# Patient Record
Sex: Female | Born: 1970 | Race: White | Hispanic: No | Marital: Married | Smoking: Never smoker
Health system: Southern US, Community
[De-identification: ages and names within clinical notes are randomized; demographics above are authoritative.]

## PROBLEM LIST (undated history)

## (undated) DIAGNOSIS — G44009 Cluster headache syndrome, unspecified, not intractable: Secondary | ICD-10-CM

## (undated) HISTORY — PX: WISDOM TOOTH EXTRACTION: SHX21

## (undated) HISTORY — DX: Cluster headache syndrome, unspecified, not intractable: G44.009

---
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
## 2007-02-12 ENCOUNTER — Emergency Department (HOSPITAL_COMMUNITY): Admission: EM | Admit: 2007-02-12 | Discharge: 2007-02-13 | Payer: Self-pay | Admitting: Emergency Medicine

## 2007-02-14 ENCOUNTER — Inpatient Hospital Stay (HOSPITAL_COMMUNITY): Admission: AD | Admit: 2007-02-14 | Discharge: 2007-02-14 | Payer: Self-pay | Admitting: Obstetrics and Gynecology

## 2007-02-16 DIAGNOSIS — O039 Complete or unspecified spontaneous abortion without complication: Secondary | ICD-10-CM

## 2007-02-16 HISTORY — PX: DILATION AND CURETTAGE OF UTERUS: SHX78

## 2007-02-16 HISTORY — DX: Complete or unspecified spontaneous abortion without complication: O03.9

## 2007-02-16 HISTORY — PX: OTHER SURGICAL HISTORY: SHX169

## 2007-03-16 ENCOUNTER — Encounter (INDEPENDENT_AMBULATORY_CARE_PROVIDER_SITE_OTHER): Payer: Self-pay | Admitting: Obstetrics and Gynecology

## 2007-03-16 ENCOUNTER — Ambulatory Visit (HOSPITAL_COMMUNITY): Admission: RE | Admit: 2007-03-16 | Discharge: 2007-03-16 | Payer: Self-pay | Admitting: Internal Medicine

## 2009-12-24 ENCOUNTER — Other Ambulatory Visit: Admission: RE | Admit: 2009-12-24 | Discharge: 2009-12-24 | Payer: Self-pay | Admitting: Internal Medicine

## 2010-04-01 ENCOUNTER — Other Ambulatory Visit: Payer: Self-pay | Admitting: Obstetrics and Gynecology

## 2010-06-30 NOTE — Op Note (Signed)
NAMEPATTI, SHORB NO.:  [id number]   MEDICAL RECORD NO.:  [id number]          PATIENT TYPE:  AMB   LOCATION:  SDC                           FACILITY:  [facility]   PHYSICIAN:  Malachi Pro. Ambrose Mantle, M.D. DATE OF BIRTH:  [birth year]   DATE OF PROCEDURE:  03/16/2007  DATE OF DISCHARGE:                               OPERATIVE REPORT   PREOPERATIVE DIAGNOSES:  Nonviable intrauterine pregnancy at 12+ weeks.   POSTOPERATIVE DIAGNOSES:  Nonviable intrauterine pregnancy at 12+ weeks.   OPERATION:  D&E.   OPERATOR:  Dr. Ambrose Mantle.   ANESTHESIA:  General anesthesia.   The patient was brought to the operating room and placed under  satisfactory general anesthesia.  She had had a normal intrauterine  pregnancy at 10 weeks and 1 day by ultrasound but at her a new OB workup  at 13 weeks and 2 days, there was no heart rate,  ultrasound showed the  gestation to be 12 weeks and 6 days with no movement and no fetal heart  rate. One week later, the ultrasound was repeated the fetus measured 12  weeks and 2 days, no movement no fetal heart rate. She was brought to  the operating room, placed under satisfactory general anesthesia and  placed in lithotomy position in the [place] stirrups.  Exam revealed the  uterus to be retroverted, approximately 12-13 weeks size, the adnexa  were free of masses.  The vulva, vagina and perineum were prepped with  Betadine solution and draped as a sterile field. After prepping the  urethra, the bladder was emptied with a [place] catheter.  The cervix was  grasped with a tenaculum.  There was a small amount of tissue in the  cervical opening but I do not think this was products of conception.  The uterus was sounded very carefully posteriorly to 13 cm.  The  cervical canal was then dilated to a Pratt dilator of 35. I then wanted  to reduce the size of the endometrial cavity, so I used blunt  instruments using the ring forceps to rupture the membranes and I  evacuated what looked like most of the fetus and then in spite of the  fact the uterus was quite retroverted, the placenta was on the anterior  wall of the uterus and with multiple circuits with the ring forceps  I  was able to reduce the size of the uterine cavity significantly by  removing placental tissue. Pitocin was begun and then I also massaged  the uterus to try to contract the uterus more. Then I began using the #9  curved suction curette and I was able to curette the endometrial cavity  until it felt like there was no tissue left.  I then used a sharp  curette to curette the endometrial cavity to see if there were any bumps  on the surface of the endometrial cavity, I could feel none.  I went  again with the curved suction curette, found no additional tissue and  terminated the procedure.  Blood loss was difficult to estimate because  there was amniotic  fluid, but I estimated blood loss at about 200 mL.  Sponge and needle counts were correct.  The patient was returned to  recovery in satisfactory condition.  Her blood type is O+.      Malachi Pro. Ambrose Mantle, M.D.  Electronically Signed    TFH/MEDQ  D:  03/16/2007  T:  03/16/2007  Job:  [id number]

## 2010-11-05 LAB — DIFFERENTIAL
Eosinophils Absolute: 0.1
Lymphs Abs: 1.9
Monocytes Absolute: 0.5
Monocytes Relative: 4
Neutrophils Relative %: 76

## 2010-11-05 LAB — CBC
Hemoglobin: 12.4
MCHC: 35.2
RDW: 12.3

## 2010-11-05 LAB — COMPREHENSIVE METABOLIC PANEL
Albumin: 3 — ABNORMAL LOW
CO2: 22
Chloride: 106
Glucose, Bld: 92
Total Protein: 6

## 2010-11-05 LAB — ABO/RH: ABO/RH(D): O POS

## 2010-11-20 LAB — URINALYSIS, ROUTINE W REFLEX MICROSCOPIC
Glucose, UA: NEGATIVE
Hgb urine dipstick: NEGATIVE
Ketones, ur: >80 — AB
Leukocytes, UA: NEGATIVE
Nitrite: NEGATIVE
Protein, ur: 30 — AB
Specific Gravity, Urine: 1.026
Urobilinogen, UA: 1
pH: 6

## 2010-11-20 LAB — CBC
HCT: 38.4
Hemoglobin: 13.2
MCHC: 34.5
MCV: 89.8
Platelets: 308
RBC: 4.27
RDW: 12.7
WBC: 15.1 — ABNORMAL HIGH

## 2010-11-20 LAB — COMPREHENSIVE METABOLIC PANEL
Albumin: 3.2 — ABNORMAL LOW
Alkaline Phosphatase: 70
CO2: 25
Potassium: 3.9
Total Bilirubin: 0.7

## 2010-11-20 LAB — COMPREHENSIVE METABOLIC PANEL WITH GFR
ALT: 13
AST: 13
BUN: 4 — ABNORMAL LOW
Calcium: 9.4
Chloride: 103
Creatinine, Ser: 0.61
GFR calc Af Amer: >60
GFR calc non Af Amer: >60
Glucose, Bld: 108 — ABNORMAL HIGH
Sodium: 135
Total Protein: 6.9

## 2010-11-20 LAB — DIFFERENTIAL
Basophils Absolute: 0
Basophils Relative: 0
Eosinophils Absolute: 0
Eosinophils Relative: 0
Lymphocytes Relative: 8 — ABNORMAL LOW
Lymphs Abs: 1.1
Monocytes Absolute: 0.3
Monocytes Relative: 2 — ABNORMAL LOW
Neutro Abs: 13.6 — ABNORMAL HIGH
Neutrophils Relative %: 90 — ABNORMAL HIGH

## 2010-11-20 LAB — WET PREP, GENITAL
Trich, Wet Prep: NONE SEEN
Yeast Wet Prep HPF POC: NONE SEEN

## 2010-11-20 LAB — GC/CHLAMYDIA PROBE AMP, GENITAL
Chlamydia, DNA Probe: NEGATIVE
GC Probe Amp, Genital: NEGATIVE

## 2010-11-20 LAB — URINE MICROSCOPIC-ADD ON

## 2010-11-20 LAB — HCG, QUANTITATIVE, PREGNANCY: hCG, Beta Chain, Quant, S: 148647 — ABNORMAL HIGH

## 2010-11-20 LAB — PREGNANCY, URINE: Preg Test, Ur: POSITIVE

## 2010-11-20 LAB — ABO/RH: ABO/RH(D): O POS

## 2011-02-16 HISTORY — PX: KNEE SURGERY: SHX244

## 2011-10-19 ENCOUNTER — Ambulatory Visit: Payer: Self-pay | Admitting: Sports Medicine

## 2013-02-22 ENCOUNTER — Encounter: Payer: Self-pay | Admitting: Family Medicine

## 2013-02-22 ENCOUNTER — Ambulatory Visit (INDEPENDENT_AMBULATORY_CARE_PROVIDER_SITE_OTHER): Payer: BC Managed Care – PPO | Admitting: Family Medicine

## 2013-02-22 VITALS — BP 126/76 | HR 76 | Temp 98.1°F | Ht 64.0 in | Wt 190.8 lb

## 2013-02-22 DIAGNOSIS — I889 Nonspecific lymphadenitis, unspecified: Secondary | ICD-10-CM

## 2013-02-22 DIAGNOSIS — J309 Allergic rhinitis, unspecified: Secondary | ICD-10-CM

## 2013-02-22 LAB — CBC WITH DIFFERENTIAL/PLATELET
BASOS PCT: 0.4 % (ref 0.0–3.0)
Basophils Absolute: 0 10*3/uL (ref 0.0–0.1)
EOS PCT: 1.2 % (ref 0.0–5.0)
Eosinophils Absolute: 0.1 10*3/uL (ref 0.0–0.7)
HEMATOCRIT: 39.1 % (ref 36.0–46.0)
HEMOGLOBIN: 13.1 g/dL (ref 12.0–15.0)
Lymphocytes Relative: 20 % (ref 12.0–46.0)
Lymphs Abs: 2.4 10*3/uL (ref 0.7–4.0)
MCHC: 33.5 g/dL (ref 30.0–36.0)
MCV: 89.7 fl (ref 78.0–100.0)
MONO ABS: 0.6 10*3/uL (ref 0.1–1.0)
Monocytes Relative: 4.9 % (ref 3.0–12.0)
NEUTROS PCT: 73.5 % (ref 43.0–77.0)
Neutro Abs: 8.7 10*3/uL — ABNORMAL HIGH (ref 1.4–7.7)
Platelets: 314 10*3/uL (ref 150.0–400.0)
RBC: 4.36 Mil/uL (ref 3.87–5.11)
RDW: 12.7 % (ref 11.5–14.6)
WBC: 11.9 10*3/uL — AB (ref 4.5–10.5)

## 2013-02-22 MED ORDER — FLUTICASONE PROPIONATE 50 MCG/ACT NA SUSP: 2.0000 | Freq: Every day | NASAL | Status: DC

## 2013-02-22 NOTE — Progress Notes (Signed)
   Subjective:    Patient ID: Teresa Bauer, female    DOB: June 02, 1970, 43 y.o.   MRN: [id number]  HPI   Very pleasant 43 yo female here to establish care with complaint of:  1.  Lump on back of neck that was painful- improving now.  Noticed it last week.  Was never warm to touch that she is aware of. No scalp infections or itching.  2.  Allergic rhinitis- watery/itchy eyes, sinus pressure and ears popping x 2 months.  Works in a [employer] at [employer].  Constantly around dust. Not sure if she is allergic to dust.  No CP, SOB.  Patient Active Problem List   Diagnosis Date Noted  . Allergic rhinitis 02/22/2013  . Lymphadenitis 02/22/2013   Past Medical History  Diagnosis Date  . Headaches, cluster    Past Surgical History  Procedure Laterality Date  . Miscarriage  2009  . Knee surgery  2013    L knee  . Tonsillectomy  1988  . Wisdom tooth extraction  1991-92   History  Substance Use Topics  . Smoking status: Never Smoker   . Smokeless tobacco: Not on file  . Alcohol Use: Yes   Family History  Problem Relation Age of Onset  . Heart disease Mother   . Diabetes Mother   . Hypertension Mother   . Heart disease Father   . Hypertension Father   . Hypertension Sister   . Asthma Sister   . Hypertension Brother   . Heart disease Maternal Grandmother    Allergies  Allergen Reactions  . Aspirin    No current outpatient prescriptions on file prior to visit.   No current facility-administered medications on file prior to visit.   The PMH, PSH, Social History, Family History, Medications, and allergies have been reviewed in [facility], and have been updated if relevant.  Review of Systems  Constitutional: Negative for fever, fatigue and unexpected weight change.  HENT: Positive for congestion, ear pain, postnasal drip, rhinorrhea and sinus pressure.   Eyes: Negative for photophobia, redness and visual disturbance.  Respiratory: Negative for apnea and chest tightness.          Objective:   Physical Exam  Nursing note and vitals reviewed. Constitutional: She appears well-developed and well-nourished. No distress.  HENT:  Head: Normocephalic.  Right Ear: Hearing normal. Tympanic membrane is retracted.  Left Ear: Hearing normal. Tympanic membrane is retracted.  Nose: Mucosal edema and rhinorrhea present. No sinus tenderness.  Eyes: Conjunctivae are normal. Pupils are equal, round, and reactive to light.  Lymphadenopathy:       Head (right side): No submental, no submandibular, no tonsillar, no preauricular, no posterior auricular and no occipital adenopathy present.       Head (left side): No submental, no submandibular, no tonsillar, no preauricular, no posterior auricular and no occipital adenopathy present.    She has no cervical adenopathy.    She has no axillary adenopathy.  Skin: Skin is warm, dry and intact.  Psychiatric: She has a normal mood and affect. Her speech is normal and behavior is normal.          Assessment & Plan:

## 2013-02-22 NOTE — Patient Instructions (Addendum)
Nice to meet you. Please start flonase and Allegra or Claritin OTC.  I will call you with your lab results.  Please schedule a complete physical after 05/2013.

## 2013-02-22 NOTE — Assessment & Plan Note (Signed)
No mass palpable on exam. Probable lymphadenitis that resolved. Reassurance provided. Will check CBC. Orders Placed This Encounter  Procedures  . CBC with Differential

## 2013-02-22 NOTE — Progress Notes (Signed)
Pre-visit discussion using our clinic review tool. No additional management support is needed unless otherwise documented below in the visit note.  

## 2013-02-22 NOTE — Assessment & Plan Note (Signed)
Deteriorated. eRx for flonase Advised claritin or allegra. Call or return to clinic prn if these symptoms worsen or fail to improve as anticipated. She declines referral to allergist for testing.

## 2013-06-08 ENCOUNTER — Encounter: Payer: BC Managed Care – PPO | Admitting: Family Medicine

## 2013-06-15 ENCOUNTER — Ambulatory Visit (INDEPENDENT_AMBULATORY_CARE_PROVIDER_SITE_OTHER): Payer: BC Managed Care – PPO | Admitting: Internal Medicine

## 2013-06-15 ENCOUNTER — Encounter: Payer: Self-pay | Admitting: Internal Medicine

## 2013-06-15 VITALS — BP 108/70 | HR 73 | Temp 98.3°F | Wt 184.0 lb

## 2013-06-15 DIAGNOSIS — J309 Allergic rhinitis, unspecified: Secondary | ICD-10-CM

## 2013-06-15 NOTE — Progress Notes (Signed)
Pre visit review using our clinic review tool, if applicable. No additional management support is needed unless otherwise documented below in the visit note. 

## 2013-06-15 NOTE — Progress Notes (Signed)
HPI  Pt presents to the clinic today with c/o scratchy throat, facial pain and pressure, headache and ear fullness. She reports this started 2-3 days ago. She denies fever, chills or body aches. She has tried claritin D OTC  (but has not taken it in the last 3 days) with no relief. She has a order for flonase but she reports she has never taken it. She does have a history of allergies but not asthma. She has not had sick contacts. She does not smoke.  Review of Systems    Past Medical History  Diagnosis Date  . Headaches, cluster     Family History  Problem Relation Age of Onset  . Heart disease Mother   . Diabetes Mother   . Hypertension Mother   . Heart disease Father   . Hypertension Father   . Hypertension Sister   . Asthma Sister   . Hypertension Brother   . Heart disease Maternal Grandmother     History   Social History  . Marital Status: Married    Spouse Name: N/A    Number of Children: N/A  . Years of Education: N/A   Occupational History  . Not on file.   Social History Main Topics  . Smoking status: Never Smoker   . Smokeless tobacco: Not on file  . Alcohol Use: Yes  . Drug Use: No  . Sexual Activity: Yes   Other Topics Concern  . Not on file   Social History Narrative   Married.   Works at [employer].   One son- 56, 72 month old grand daughter named Lyndee Leo.    Allergies  Allergen Reactions  . Aspirin      Constitutional: Positive headache,Denies fatigue, fever or abrupt weight changes.  HEENT:  Positive facial pain, nasal congestion and sore throat. Denies eye redness, ear pain, ringing in the ears, wax buildup, runny nose or bloody nose. Respiratory: Positive cough. Denies difficulty breathing or shortness of breath.  Cardiovascular: Denies chest pain, chest tightness, palpitations or swelling in the hands or feet.   No other specific complaints in a complete review of systems (except as listed in HPI above).  Objective:  BP 108/70  Pulse  73  Temp(Src) 98.3 F (36.8 C) (Oral)  Wt 184 lb (83.462 kg)  SpO2 98%   General: Appears her stated age, well developed, well nourished in NAD. HEENT: Head: normal shape and size, no sinus tenderness noted; Eyes: sclera white, no icterus, conjunctiva pink, PERRLA and EOMs intact; Ears: Tm's gray and intact, normal light reflex, + serous effusion noted on the right; Nose: mucosa pink and moist, septum midline; Throat/Mouth: + PND. Teeth present, mucosa erythematous and moist, no exudate noted, no lesions or ulcerations noted.  Neck: Neck supple, trachea midline. No massses, lumps or thyromegaly present.  Cardiovascular: Normal rate and rhythm. S1,S2 noted.  No murmur, rubs or gallops noted. No JVD or BLE edema. No carotid bruits noted. Pulmonary/Chest: Normal effort and positive vesicular breath sounds. No respiratory distress. No wheezes, rales or ronchi noted.      Assessment & Plan:   Allergic rhinitis  Can use a Neti Pot which can be purchased from your local drug store. Continue Claritin Add flonase to your regimen (now OTC0 Watch for fever, worsening fever, chills or body aches  RTC as needed or if symptoms persist.

## 2013-06-15 NOTE — Patient Instructions (Addendum)
Allergic Rhinitis Allergic rhinitis is when the mucous membranes in the nose respond to allergens. Allergens are particles in the air that cause your body to have an allergic reaction. This causes you to release allergic antibodies. Through a chain of events, these eventually cause you to release histamine into the blood stream. Although meant to protect the body, it is this release of histamine that causes your discomfort, such as frequent sneezing, congestion, and an itchy, runny nose.  CAUSES  Seasonal allergic rhinitis (hay fever) is caused by pollen allergens that may come from grasses, trees, and weeds. Year-round allergic rhinitis (perennial allergic rhinitis) is caused by allergens such as house dust mites, pet dander, and mold spores.  SYMPTOMS   Nasal stuffiness (congestion).  Itchy, runny nose with sneezing and tearing of the eyes. DIAGNOSIS  Your health care provider can help you determine the allergen or allergens that trigger your symptoms. If you and your health care provider are unable to determine the allergen, skin or blood testing may be used. TREATMENT  Allergic Rhinitis does not have a cure, but it can be controlled by:  Medicines and allergy shots (immunotherapy).  Avoiding the allergen. Hay fever may often be treated with antihistamines in pill or nasal spray forms. Antihistamines block the effects of histamine. There are over-the-counter medicines that may help with nasal congestion and swelling around the eyes. Check with your health care provider before taking or giving this medicine.  If avoiding the allergen or the medicine prescribed do not work, there are many new medicines your health care provider can prescribe. Stronger medicine may be used if initial measures are ineffective. Desensitizing injections can be used if medicine and avoidance does not work. Desensitization is when a patient is given ongoing shots until the body becomes less sensitive to the allergen.  Make sure you follow up with your health care provider if problems continue. HOME CARE INSTRUCTIONS It is not possible to completely avoid allergens, but you can reduce your symptoms by taking steps to limit your exposure to them. It helps to know exactly what you are allergic to so that you can avoid your specific triggers. SEEK MEDICAL CARE IF:   You have a fever.  You develop a cough that does not stop easily (persistent).  You have shortness of breath.  You start wheezing.  Symptoms interfere with normal daily activities. Document Released: 10/27/2000 Document Revised: 11/22/2012 Document Reviewed: 10/09/2012 Exit[employer] Patient Information 2014 [employer].  

## 2013-08-08 ENCOUNTER — Ambulatory Visit (INDEPENDENT_AMBULATORY_CARE_PROVIDER_SITE_OTHER): Payer: BC Managed Care – PPO | Admitting: Family Medicine

## 2013-08-08 ENCOUNTER — Encounter: Payer: Self-pay | Admitting: Family Medicine

## 2013-08-08 VITALS — BP 120/74 | HR 82 | Temp 98.4°F | Ht 64.0 in | Wt 182.5 lb

## 2013-08-08 DIAGNOSIS — M6580 Other synovitis and tenosynovitis, unspecified site: Secondary | ICD-10-CM

## 2013-08-08 DIAGNOSIS — M76899 Other specified enthesopathies of unspecified lower limb, excluding foot: Secondary | ICD-10-CM

## 2013-08-08 NOTE — Patient Instructions (Signed)
BODYHELIX  Www.bodyhelix.com  Use website instuctions for measurement of limb to determine size.   Over the years, I have found that athletes and active people like these products a lot. Most of them cost about 40 dollars.  (I have no financial interest in this company and gain nothing from recommending their products)   Hamstring walking program:  comfortable pace 1.5-4.0 mph, start 0, increase every 2 mins by 3 degrees, 0-3-07-25-10-15-12-9-6-3-0, repeat until 30 mins complete.  If hard, decrease the angle!  At least once in each direction, go sideways for 2 minutes, at 2-3 degrees, but < 1 MPH  It is OK to do bicycle riding, elliptical, etc. Hold off on running until OK'd by MD.  Try to do this workout 3 times a week or more in addition to above.   Hamstring Rehab 1)  HS curls - start with 3 sets of 15 and progress to 3 sets of 30 every 3 days 2)  HS curls with weight - begin with 2 lb ankle weight when above is easy and start with 3 sets of 10; increasing every 5 days by 5 reps - eg to 3 sets of 15 reps 3)  HS swings - swing leg backwards and curl at the end of the swing; follow same schedule as above. 4)  HS running lunges - running lunge position means no more than 45 degrees of knee flexion and running motion.  follow same schedule as above.

## 2013-08-08 NOTE — Progress Notes (Signed)
Pre visit review using our clinic review tool, if applicable. No additional management support is needed unless otherwise documented below in the visit note. 

## 2013-08-08 NOTE — Progress Notes (Signed)
[place] [address] Phone: [phone] Fax: [phone]  Patient ID: Teresa Bauer MRN: [id number], DOB: 08-12-1970, 43 y.o. Date of Encounter: 08/08/2013  Primary Physician:  Arnette Norris, MD   Chief Complaint: Swollen Right Knee   Subjective:   History of Present Illness:  Teresa Bauer is a 43 y.o. very pleasant female patient who presents with the following:  R knee pain. Pulled HS and back is pulling with walking and aching with resting. With back of leg.  She has not really had any kind of a cold injury that she can recall. She has not been having any effusion. She has not been having any locking up or giving way of her knee. She has not had any prior significant right-sided knee injury. She will have some fullness in the posterior aspect of her RIGHT knee. She is not having any significant anterior pain or medial or lateral pain.  L knee: meniscal tear, and partial tear of ACL. 2013.    Past Medical History, Surgical History, Social History, Family History, Problem List, Medications, and Allergies have been reviewed and updated if relevant.  Review of Systems:  GEN: No fevers, chills. Nontoxic. Primarily MSK c/o today. MSK: Detailed in the HPI GI: tolerating PO intake without difficulty Neuro: No numbness, parasthesias, or tingling associated. Otherwise the pertinent positives of the ROS are noted above.   Objective:   Physical Examination: BP 120/74  Pulse 82  Temp(Src) 98.4 F (36.9 C) (Oral)  Ht 5\' 4"  (1.626 m)  Wt 182 lb 8 oz (82.781 kg)  BMI 31.31 kg/m2  LMP 07/26/2013   GEN: WDWN, NAD, Non-toxic, Alert & Oriented x 3 HEENT: Atraumatic, Normocephalic.  Ears and Nose: No external deformity. EXTR: No clubbing/cyanosis/edema NEURO: Normal gait.  PSYCH: Normally interactive. Conversant. Not depressed or anxious appearing.  Calm demeanor.   Knee:  L Gait: Normal heel toe pattern ROM: 0-130 Effusion: neg Echymosis or edema:  none Patellar tendon NT Painful PLICA: neg Patellar grind: negative Medial and lateral patellar facet loading: negative medial and lateral joint lines:NT Mcmurray's neg Flexion-pinch neg Varus and valgus stress: stable Lachman: neg Ant and Post drawer: neg Hip abduction, IR, ER: WNL Hip flexion str: 5/5 Hip abd: 5/5 Quad: 5/5 VMO atrophy:No Hamstring concentric and eccentric: 4++/5  There is some provoke a little pain on the medial and lateral hamstring at its distal insertion when this is isolated in 15 of flexion with eccentric contraction.  Radiology: No results found.  Assessment & Plan:   Hamstring tendinitis at origin  >25 minutes spent in face to face time with patient, >50% spent in counselling or coordination of care: insertional tendinopathy of the hamstring of all 3 tendons distally. I reviewed the anatomy in detail, and reviewed an extensive rehabilitation program with the patient.  Follow-up: Return in about 4 weeks (around 09/05/2013). Unless noted above, the patient is to follow-up if symptoms worsen. Red flags were reviewed with the patient.  Signed,  Maud Deed. Copland, MD, [facility]  Patient Instructions  BODYHELIX  Www.bodyhelix.com  Use website instuctions for measurement of limb to determine size.   Over the years, I have found that athletes and active people like these products a lot. Most of them cost about 40 dollars.  (I have no financial interest in this company and gain nothing from recommending their products)   Hamstring walking program:  comfortable pace 1.5-4.0 mph, start 0, increase every 2 mins by 3 degrees, 0-3-07-25-10-15-12-9-6-3-0,  repeat until 30 mins complete.  If hard, decrease the angle!  At least once in each direction, go sideways for 2 minutes, at 2-3 degrees, but < 1 MPH  It is OK to do bicycle riding, elliptical, etc. Hold off on running until OK'd by MD.  Try to do this workout 3 times a week or more in  addition to above.   Hamstring Rehab 1)  HS curls - start with 3 sets of 15 and progress to 3 sets of 30 every 3 days 2)  HS curls with weight - begin with 2 lb ankle weight when above is easy and start with 3 sets of 10; increasing every 5 days by 5 reps - eg to 3 sets of 15 reps 3)  HS swings - swing leg backwards and curl at the end of the swing; follow same schedule as above. 4)  HS running lunges - running lunge position means no more than 45 degrees of knee flexion and running motion.  follow same schedule as above.      Discontinued Medications   No medications on file   Current Medications at Discharge:   Medication List       This list is accurate as of: 08/08/13 11:59 PM.  Always use your most recent med list.               fluticasone 50 MCG/ACT nasal spray  Commonly known as:  FLONASE  Place 2 sprays into both nostrils daily.     loratadine-pseudoephedrine 10-240 MG per 24 hr tablet  Commonly known as:  CLARITIN-D 24-hour  Take 1 tablet by mouth daily.

## 2013-08-29 ENCOUNTER — Encounter: Payer: BC Managed Care – PPO | Admitting: Family Medicine

## 2013-08-29 DIAGNOSIS — Z0289 Encounter for other administrative examinations: Secondary | ICD-10-CM

## 2013-09-07 ENCOUNTER — Other Ambulatory Visit (HOSPITAL_COMMUNITY)
Admission: RE | Admit: 2013-09-07 | Discharge: 2013-09-07 | Disposition: A | Discharge status: Home / Self Care | Payer: BC Managed Care – PPO | Source: Ambulatory Visit | Attending: Family Medicine | Admitting: Family Medicine

## 2013-09-07 ENCOUNTER — Ambulatory Visit (INDEPENDENT_AMBULATORY_CARE_PROVIDER_SITE_OTHER): Payer: BC Managed Care – PPO | Admitting: Family Medicine

## 2013-09-07 ENCOUNTER — Encounter: Payer: Self-pay | Admitting: Family Medicine

## 2013-09-07 VITALS — BP 126/82 | HR 81 | Temp 97.8°F | Ht 64.25 in | Wt 179.8 lb

## 2013-09-07 DIAGNOSIS — Z01419 Encounter for gynecological examination (general) (routine) without abnormal findings: Secondary | ICD-10-CM | POA: Insufficient documentation

## 2013-09-07 DIAGNOSIS — Z0000 Encounter for general adult medical examination without abnormal findings: Secondary | ICD-10-CM | POA: Insufficient documentation

## 2013-09-07 DIAGNOSIS — J309 Allergic rhinitis, unspecified: Secondary | ICD-10-CM

## 2013-09-07 DIAGNOSIS — Z136 Encounter for screening for cardiovascular disorders: Secondary | ICD-10-CM

## 2013-09-07 DIAGNOSIS — R52 Pain, unspecified: Secondary | ICD-10-CM

## 2013-09-07 DIAGNOSIS — Z1151 Encounter for screening for human papillomavirus (HPV): Secondary | ICD-10-CM | POA: Insufficient documentation

## 2013-09-07 DIAGNOSIS — Z1231 Encounter for screening mammogram for malignant neoplasm of breast: Secondary | ICD-10-CM

## 2013-09-07 LAB — CBC WITH DIFFERENTIAL/PLATELET
Basophils Absolute: 0 10*3/uL (ref 0.0–0.1)
Basophils Relative: 0.5 % (ref 0.0–3.0)
EOS PCT: 1.2 % (ref 0.0–5.0)
Eosinophils Absolute: 0.1 10*3/uL (ref 0.0–0.7)
HCT: 38.7 % (ref 36.0–46.0)
HEMOGLOBIN: 13 g/dL (ref 12.0–15.0)
LYMPHS ABS: 2.2 10*3/uL (ref 0.7–4.0)
LYMPHS PCT: 23.1 % (ref 12.0–46.0)
MCHC: 33.7 g/dL (ref 30.0–36.0)
MCV: 91.3 fl (ref 78.0–100.0)
MONO ABS: 0.5 10*3/uL (ref 0.1–1.0)
MONOS PCT: 5.1 % (ref 3.0–12.0)
Neutro Abs: 6.8 10*3/uL (ref 1.4–7.7)
Neutrophils Relative %: 70.1 % (ref 43.0–77.0)
PLATELETS: 355 10*3/uL (ref 150.0–400.0)
RBC: 4.24 Mil/uL (ref 3.87–5.11)
RDW: 12.5 % (ref 11.5–15.5)
WBC: 9.7 10*3/uL (ref 4.0–10.5)

## 2013-09-07 LAB — LIPID PANEL
CHOL/HDL RATIO: 4
Cholesterol: 186 mg/dL (ref 0–200)
HDL: 46.9 mg/dL (ref >39.00)
LDL CALC: 120 mg/dL — AB (ref 0–99)
NONHDL: 139.1
TRIGLYCERIDES: 96 mg/dL (ref 0.0–149.0)
VLDL: 19.2 mg/dL (ref 0.0–40.0)

## 2013-09-07 LAB — COMPREHENSIVE METABOLIC PANEL
ALK PHOS: 71 U/L (ref 39–117)
ALT: 14 U/L (ref 0–35)
AST: 17 U/L (ref 0–37)
Albumin: 3.8 g/dL (ref 3.5–5.2)
BUN: 10 mg/dL (ref 6–23)
CO2: 28 mEq/L (ref 19–32)
Calcium: 9 mg/dL (ref 8.4–10.5)
Chloride: 105 mEq/L (ref 96–112)
Creatinine, Ser: 0.9 mg/dL (ref 0.4–1.2)
GFR: 71.07 mL/min (ref >60.00)
GLUCOSE: 93 mg/dL (ref 70–99)
Potassium: 3.8 mEq/L (ref 3.5–5.1)
Sodium: 138 mEq/L (ref 135–145)
TOTAL PROTEIN: 7 g/dL (ref 6.0–8.3)
Total Bilirubin: 0.7 mg/dL (ref 0.2–1.2)

## 2013-09-07 LAB — VITAMIN D 25 HYDROXY (VIT D DEFICIENCY, FRACTURES): VITD: 71.57 ng/mL (ref 30.00–100.00)

## 2013-09-07 LAB — TSH: TSH: 1.16 u[IU]/mL (ref 0.35–4.50)

## 2013-09-07 LAB — VITAMIN B12: VITAMIN B 12: 474 pg/mL (ref 211–911)

## 2013-09-07 NOTE — Progress Notes (Signed)
Pre visit review using our clinic review tool, if applicable. No additional management support is needed unless otherwise documented below in the visit note. 

## 2013-09-07 NOTE — Assessment & Plan Note (Signed)
Will check electrolytes and vitamin b12 and Vit D today.

## 2013-09-07 NOTE — Patient Instructions (Addendum)
Great to see you. We will call you with your lab results.  Please call to set up your mammogram.

## 2013-09-07 NOTE — Assessment & Plan Note (Signed)
Pap smear today. Mammogram ordered.

## 2013-09-07 NOTE — Addendum Note (Signed)
Addended by: Modena Nunnery on: 09/07/2013 09:00 AM   Modules accepted: Orders

## 2013-09-07 NOTE — Assessment & Plan Note (Signed)
Reviewed preventive care protocols, scheduled due services, and updated immunizations Discussed nutrition, exercise, diet, and healthy lifestyle.  Orders Placed This Encounter  Procedures  . MM Digital Screening  . CBC with Differential  . Comprehensive metabolic panel  . Lipid panel  . TSH  . Vitamin B12  . Vitamin D, 25-hydroxy

## 2013-09-07 NOTE — Progress Notes (Addendum)
Subjective:   Patient ID: Teresa Bauer, female    DOB: 11-20-1970, 43 y.o.   MRN: [id number]  Teresa Bauer is a pleasant 43 y.o. year old female who presents to clinic today with Annual Exam  on 09/07/2013  HPI:  G2P1- no h/o abnormal pap smears in past 5 years. Last pap over 2 years ago.  LMP 07/26/13, periods regular.  Overdue for mammogram. Maternal aunt - + breast CA in 72s.  Only complaint today is some aches in her legs- sometimes nocturnal but sometimes occurs during the day.  Up and down on her feet all day.  No radiculopathy or LE weakness.  Current Outpatient Prescriptions on File Prior to Visit  Medication Sig Dispense Refill  . fluticasone (FLONASE) 50 MCG/ACT nasal spray Place 2 sprays into both nostrils daily.  16 g  6  . loratadine-pseudoephedrine (CLARITIN-D 24-HOUR) 10-240 MG per 24 hr tablet Take 1 tablet by mouth daily.       No current facility-administered medications on file prior to visit.    Allergies  Allergen Reactions  . Aspirin     Past Medical History  Diagnosis Date  . Headaches, cluster     Past Surgical History  Procedure Laterality Date  . Miscarriage  2009  . Knee surgery  2013    L knee  . Tonsillectomy  1988  . Wisdom tooth extraction  1991-92    Family History  Problem Relation Age of Onset  . Heart disease Mother   . Diabetes Mother   . Hypertension Mother   . Heart disease Father   . Hypertension Father   . Hypertension Sister   . Asthma Sister   . Hypertension Brother   . Heart disease Maternal Grandmother     History   Social History  . Marital Status: Married    Spouse Name: N/A    Number of Children: N/A  . Years of Education: N/A   Occupational History  . Not on file.   Social History Main Topics  . Smoking status: Never Smoker   . Smokeless tobacco: Never Used  . Alcohol Use: No  . Drug Use: No  . Sexual Activity: Yes   Other Topics Concern  . Not on file   Social History Narrative   Married.     Works at [employer].   One son- 89, 51 month old grand daughter named Lyndee Leo.   The PMH, PSH, Social History, Family History, Medications, and allergies have been reviewed in [facility], and have been updated if relevant.    Review of Systems See HPI No changes in bowel habits Denies urinary symptoms No vaginal discharge or burning No CP or SOB Denies any anxiety or depression No blurred vision   She has been trying to lose weight - grilling more, not eating out as much Wt Readings from Last 3 Encounters:  09/07/13 179 lb 12 oz (81.534 kg)  08/08/13 182 lb 8 oz (82.781 kg)  06/15/13 184 lb (83.462 kg)    Current Outpatient Prescriptions on File Prior to Visit  Medication Sig Dispense Refill  . fluticasone (FLONASE) 50 MCG/ACT nasal spray Place 2 sprays into both nostrils daily.  16 g  6  . loratadine-pseudoephedrine (CLARITIN-D 24-HOUR) 10-240 MG per 24 hr tablet Take 1 tablet by mouth daily.       No current facility-administered medications on file prior to visit.    Allergies  Allergen Reactions  . Aspirin     Past Medical History  Diagnosis Date  . Headaches, cluster     Past Surgical History  Procedure Laterality Date  . Miscarriage  2009  . Knee surgery  2013    L knee  . Tonsillectomy  1988  . Wisdom tooth extraction  1991-92    Family History  Problem Relation Age of Onset  . Heart disease Mother   . Diabetes Mother   . Hypertension Mother   . Heart disease Father   . Hypertension Father   . Hypertension Sister   . Asthma Sister   . Hypertension Brother   . Heart disease Maternal Grandmother     History   Social History  . Marital Status: Married    Spouse Name: N/A    Number of Children: N/A  . Years of Education: N/A   Occupational History  . Not on file.   Social History Main Topics  . Smoking status: Never Smoker   . Smokeless tobacco: Never Used  . Alcohol Use: No  . Drug Use: No  . Sexual Activity: Yes   Other Topics Concern  .  Not on file   Social History Narrative   Married.   Works at [employer].   One son- 35, 68 month old grand daughter named Lyndee Leo.   The PMH, PSH, Social History, Family History, Medications, and allergies have been reviewed in [facility], and have been updated if relevant.  Objective:    LMP 07/26/2013   Physical Exam   General:  Well-developed,well-nourished,in no acute distress; alert,appropriate and cooperative throughout examination Head:  normocephalic and atraumatic.   Eyes:  vision grossly intact, pupils equal, pupils round, and pupils reactive to light.   Ears:  R ear normal and L ear normal.   Nose:  no external deformity.   Mouth:  good dentition.   Neck:  No deformities, masses, or tenderness noted. Breasts:  No mass, nodules, thickening, tenderness, bulging, retraction, inflamation, nipple discharge or skin changes noted.   Lungs:  Normal respiratory effort, chest expands symmetrically. Lungs are clear to auscultation, no crackles or wheezes. Heart:  Normal rate and regular rhythm. S1 and S2 normal without gallop, murmur, click, rub or other extra sounds. Abdomen:  Bowel sounds positive,abdomen soft and non-tender without masses, organomegaly or hernias noted. Rectal:  no external abnormalities.   Genitalia:  Pelvic Exam:        External: normal female genitalia without lesions or masses        Vagina: normal without lesions or masses        Cervix: normal without lesions or masses        Adnexa: normal bimanual exam without masses or fullness        Uterus: normal by palpation        Pap smear: performed Msk:  No deformity or scoliosis noted of thoracic or lumbar spine.   Extremities:  No clubbing, cyanosis, edema, or deformity noted with normal full range of motion of all joints.   Neurologic:  alert & oriented X3 and gait normal.   Skin:  Intact without suspicious lesions or rashes Cervical Nodes:  No lymphadenopathy noted Axillary Nodes:  No palpable  lymphadenopathy Psych:  Cognition and judgment appear intact. Alert and cooperative with normal attention span and concentration. No apparent delusions, illusions, hallucinations       Assessment & Plan:   Routine general medical examination at a health care facility  Allergic rhinitis, unspecified allergic rhinitis type No Follow-up on file.

## 2013-09-11 ENCOUNTER — Ambulatory Visit: Payer: BC Managed Care – PPO | Admitting: Family Medicine

## 2013-09-11 DIAGNOSIS — Z0289 Encounter for other administrative examinations: Secondary | ICD-10-CM

## 2013-09-12 ENCOUNTER — Encounter: Payer: Self-pay | Admitting: *Deleted

## 2013-09-12 LAB — CYTOLOGY - PAP

## 2013-09-19 ENCOUNTER — Ambulatory Visit: Payer: BC Managed Care – PPO

## 2013-09-25 ENCOUNTER — Ambulatory Visit: Payer: BC Managed Care – PPO

## 2013-11-01 ENCOUNTER — Encounter: Payer: Self-pay | Admitting: Family Medicine

## 2013-11-01 ENCOUNTER — Ambulatory Visit (INDEPENDENT_AMBULATORY_CARE_PROVIDER_SITE_OTHER)
Admission: RE | Admit: 2013-11-01 | Discharge: 2013-11-01 | Disposition: A | Discharge status: Home / Self Care | Payer: BC Managed Care – PPO | Source: Ambulatory Visit | Attending: Family Medicine | Admitting: Family Medicine

## 2013-11-01 ENCOUNTER — Ambulatory Visit (INDEPENDENT_AMBULATORY_CARE_PROVIDER_SITE_OTHER): Payer: BC Managed Care – PPO | Admitting: Family Medicine

## 2013-11-01 VITALS — BP 104/70 | HR 73 | Temp 98.3°F | Ht 64.25 in | Wt 181.2 lb

## 2013-11-01 DIAGNOSIS — M25561 Pain in right knee: Secondary | ICD-10-CM

## 2013-11-01 DIAGNOSIS — M25569 Pain in unspecified knee: Secondary | ICD-10-CM

## 2013-11-01 NOTE — Progress Notes (Signed)
Pre visit review using our clinic review tool, if applicable. No additional management support is needed unless otherwise documented below in the visit note. 

## 2013-11-01 NOTE — Patient Instructions (Signed)

## 2013-11-01 NOTE — Progress Notes (Signed)
Dr. Frederico Hamman T. Railyn House, MD, [facility] [place], [address] Phone: [phone] Fax: [phone]  11/01/2013  Patient: Teresa Bauer, MRN: [id number], DOB: 18-Jun-1970, 43 y.o.  Primary Physician:  Arnette Norris, MD  Chief Complaint: Knee Pain  Subjective:   Teresa Bauer is a 43 y.o. very pleasant female patient who presents with the following:  The patient has been having knee pain and swelling notably over the last 3 months.  Right leg is aching down her calf - down to the back of her knee. Some in the upper thigh. No buckling.  Worse the last few weeks. Also with antero-medial knee pain. No prior injury to the R knee. I saw her 3 months ago and thought she was having HS tendinopathy.  S/p L knee arthroscopy by Vickey Huger  Past Medical History, Surgical History, Social History, Family History, Problem List, Medications, and Allergies have been reviewed and updated if relevant.  GEN: No fevers, chills. Nontoxic. Primarily MSK c/o today. MSK: Detailed in the HPI GI: tolerating PO intake without difficulty Neuro: No numbness, parasthesias, or tingling associated. Otherwise the pertinent positives of the ROS are noted above.   Objective:   BP 104/70  Pulse 73  Temp(Src) 98.3 F (36.8 C) (Oral)  Ht 5' 4.25" (1.632 m)  Wt 181 lb 4 oz (82.214 kg)  BMI 30.87 kg/m2  LMP 10/10/2013   GEN: WDWN, NAD, Non-toxic, Alert & Oriented x 3 HEENT: Atraumatic, Normocephalic.  Ears and Nose: No external deformity. EXTR: No clubbing/cyanosis/edema PSYCH: Normally interactive. Conversant. Not depressed or anxious appearing.  Calm demeanor.   Knee:  R Gait: Normal heel toe pattern, mild antalgia ROM: 0-120 Effusion: neg Echymosis or edema: none + baker's cyst Patellar tendon NT Painful PLICA: neg Patellar grind: negative Medial and lateral patellar facet loading: negative medial and lateral joint lines: medial > lateral joint  line pain Mcmurray's pain Flexion-pinch pain Varus and valgus stress: stable Lachman: neg Ant and Post drawer: neg Hip abduction, IR, ER: WNL Hip flexion str: 5/5 Hip abd: 5/5 Quad: 5/5 VMO atrophy:No Hamstring concentric and eccentric: 5/5   Radiology: Dg Knee Ap/lat W/sunrise Right  11/01/2013   CLINICAL DATA:  knee pain right  EXAM: DG KNEE - 3 VIEWS  COMPARISON:  None.  FINDINGS: There is no evidence of fracture, dislocation, or joint effusion. There is no evidence of arthropathy or other focal bone abnormality. Soft tissues are unremarkable.  IMPRESSION: Negative.   Electronically Signed   By: Lovey Newcomer M.D.   On: 11/01/2013 13:59    Assessment and Plan:   Knee pain, right - Plan: DG Knee AP/LAT W/Sunrise Right, MR Knee Right Wo Contrast  Anteromedial knee pain with significant joint line pain medially > laterally and a baker's cyst, which is highly associated with medial meniscal pathology posteriorly. Failure of 3 months of conservative care including NSAIDS, rest, activity mod, heat, and ice. Obtain an MRI of the R knee to evaluate for meniscal tear.   Follow-up: No Follow-up on file.  New Prescriptions   No medications on file   Orders Placed This Encounter  Procedures  . DG Knee AP/LAT W/Sunrise Right  . MR Knee Right Wo Contrast    Signed,  Charlayne Vultaggio T. Shiron Whetsel, MD   Patient's Medications  New Prescriptions   No medications on file  Previous Medications   FLUTICASONE (FLONASE) 50 MCG/ACT NASAL SPRAY    Place 2 sprays into both  nostrils daily.   LORATADINE-PSEUDOEPHEDRINE (CLARITIN-D 24-HOUR) 10-240 MG PER 24 HR TABLET    Take 1 tablet by mouth daily.  Modified Medications   No medications on file  Discontinued Medications   No medications on file

## 2013-11-02 ENCOUNTER — Ambulatory Visit
Admission: RE | Admit: 2013-11-02 | Discharge: 2013-11-02 | Disposition: A | Discharge status: Home / Self Care | Payer: BC Managed Care – PPO | Source: Ambulatory Visit | Attending: Family Medicine | Admitting: Family Medicine

## 2013-11-02 DIAGNOSIS — M25561 Pain in right knee: Secondary | ICD-10-CM

## 2013-11-09 ENCOUNTER — Other Ambulatory Visit: Payer: Self-pay | Admitting: Family Medicine

## 2013-11-09 DIAGNOSIS — S83411A Sprain of medial collateral ligament of right knee, initial encounter: Secondary | ICD-10-CM

## 2013-11-09 DIAGNOSIS — M2391 Unspecified internal derangement of right knee: Secondary | ICD-10-CM

## 2013-11-09 DIAGNOSIS — M25561 Pain in right knee: Secondary | ICD-10-CM

## 2013-12-10 ENCOUNTER — Encounter: Payer: Self-pay | Admitting: Family Medicine

## 2014-08-02 ENCOUNTER — Ambulatory Visit (INDEPENDENT_AMBULATORY_CARE_PROVIDER_SITE_OTHER): Payer: BLUE CROSS/BLUE SHIELD | Admitting: Family Medicine

## 2014-08-02 ENCOUNTER — Encounter: Payer: Self-pay | Admitting: Family Medicine

## 2014-08-02 VITALS — BP 126/76 | HR 92 | Temp 98.7°F | Wt 183.5 lb

## 2014-08-02 DIAGNOSIS — W57XXXA Bitten or stung by nonvenomous insect and other nonvenomous arthropods, initial encounter: Secondary | ICD-10-CM | POA: Diagnosis not present

## 2014-08-02 DIAGNOSIS — S0086XA Insect bite (nonvenomous) of other part of head, initial encounter: Secondary | ICD-10-CM | POA: Diagnosis not present

## 2014-08-02 MED ORDER — SULFAMETHOXAZOLE-TRIMETHOPRIM 800-160 MG PO TABS: 1.0000 | ORAL_TABLET | Freq: Two times a day (BID) | ORAL | Status: DC

## 2014-08-02 MED ORDER — TRIAMCINOLONE ACETONIDE 0.1 % EX CREA: 1.0000 "application " | TOPICAL_CREAM | Freq: Two times a day (BID) | CUTANEOUS | Status: DC

## 2014-08-02 NOTE — Progress Notes (Signed)
   BP 126/76 mmHg  Pulse 92  Temp(Src) 98.7 F (37.1 C) (Oral)  Wt 183 lb 8 oz (83.235 kg)  LMP 07/27/2014   CC: bug bite  Subjective:    Patient ID: Teresa Bauer, female    DOB: 12/10/1970, 43 y.o.   MRN: [id number]  HPI: Teresa Bauer is a 44 y.o. female presenting on 08/02/2014 for Insect Bite   Bug bite on right neck a few days ago (?mosquito). Yesterday morning felt sore/tenderness on right side of neck. This morning redness around bite mark. Very itchy  Treated with anti itch spray.  No fevers/chills, headache, new rashes, joint pains or abd pain.  Relevant past medical, surgical, family and social history reviewed and updated as indicated. Interim medical history since our last visit reviewed. Allergies and medications reviewed and updated. Current Outpatient Prescriptions on File Prior to Visit  Medication Sig  . fluticasone (FLONASE) 50 MCG/ACT nasal spray Place 2 sprays into both nostrils daily.  Marland Kitchen loratadine-pseudoephedrine (CLARITIN-D 24-HOUR) 10-240 MG per 24 hr tablet Take 1 tablet by mouth daily.   No current facility-administered medications on file prior to visit.    Review of Systems Per HPI unless specifically indicated above     Objective:    BP 126/76 mmHg  Pulse 92  Temp(Src) 98.7 F (37.1 C) (Oral)  Wt 183 lb 8 oz (83.235 kg)  LMP 07/27/2014  Wt Readings from Last 3 Encounters:  08/02/14 183 lb 8 oz (83.235 kg)  11/01/13 181 lb 4 oz (82.214 kg)  09/07/13 179 lb 12 oz (81.534 kg)    Physical Exam  Constitutional: She appears well-developed and well-nourished. No distress.  HENT:  Head:    Mouth/Throat: Oropharynx is clear and moist. No oropharyngeal exudate.  Swollen slightly indurated bug bite R lateral neck with mild surrounding erythema, with cephalic streaking, pruritic  Neck: Normal range of motion. Neck supple.  Lymphadenopathy:       Head (right side): Submandibular (tender) adenopathy present. No submental, no tonsillar, no  preauricular and no posterior auricular adenopathy present.       Head (left side): No submental, no submandibular, no tonsillar, no preauricular and no posterior auricular adenopathy present.    She has no cervical adenopathy.       Right: No supraclavicular adenopathy present.       Left: No supraclavicular adenopathy present.  Skin: Skin is warm and dry. There is erythema.  Nursing note and vitals reviewed.     Assessment & Plan:   Problem List Items Addressed This Visit    Bug bite of face without infection - Primary    Anticipate reactive LAD after inflamed bug bite.  Treat with TCI cream for next few days. Red flags to fill WASP for bactrim 5d course discussed including fever >101, worsening streaking or worsening despite steroid cream. Pt agrees with plan. Denies sulfa allergy.          Follow up plan: Return if symptoms worsen or fail to improve.

## 2014-08-02 NOTE — Progress Notes (Signed)
Pre visit review using our clinic review tool, if applicable. No additional management support is needed unless otherwise documented below in the visit note. 

## 2014-08-02 NOTE — Patient Instructions (Signed)
You have a bug bite that has led to inflammation of the lymph node at the ankle of your jaw. Treat with steroid cream. If fever >101, worsening instead of improving, or progressive spread of redness, fill antibiotic provided today. Let us know if any questions. Let us know if swollen gland persists past 3-4 weeks.

## 2014-08-02 NOTE — Assessment & Plan Note (Addendum)
Anticipate reactive LAD after inflamed bug bite.  Treat with TCI cream for next few days. Red flags to fill WASP for bactrim 5d course discussed including fever >101, worsening streaking or worsening despite steroid cream. Pt agrees with plan. Denies sulfa allergy.

## 2014-08-28 ENCOUNTER — Other Ambulatory Visit: Payer: Self-pay | Admitting: Family Medicine

## 2014-08-28 DIAGNOSIS — Z0000 Encounter for general adult medical examination without abnormal findings: Secondary | ICD-10-CM

## 2014-09-03 ENCOUNTER — Other Ambulatory Visit (INDEPENDENT_AMBULATORY_CARE_PROVIDER_SITE_OTHER): Payer: BLUE CROSS/BLUE SHIELD

## 2014-09-03 DIAGNOSIS — Z0000 Encounter for general adult medical examination without abnormal findings: Secondary | ICD-10-CM

## 2014-09-03 LAB — COMPREHENSIVE METABOLIC PANEL
ALT: 12 U/L (ref 0–35)
AST: 13 U/L (ref 0–37)
Albumin: 3.9 g/dL (ref 3.5–5.2)
Alkaline Phosphatase: 67 U/L (ref 39–117)
BILIRUBIN TOTAL: 0.3 mg/dL (ref 0.2–1.2)
BUN: 13 mg/dL (ref 6–23)
CO2: 28 meq/L (ref 19–32)
Calcium: 9 mg/dL (ref 8.4–10.5)
Chloride: 105 mEq/L (ref 96–112)
Creatinine, Ser: 0.84 mg/dL (ref 0.40–1.20)
GFR: 77.61 mL/min (ref >60.00)
Glucose, Bld: 102 mg/dL — ABNORMAL HIGH (ref 70–99)
Potassium: 4.3 mEq/L (ref 3.5–5.1)
Sodium: 138 mEq/L (ref 135–145)
TOTAL PROTEIN: 6.6 g/dL (ref 6.0–8.3)

## 2014-09-03 LAB — CBC WITH DIFFERENTIAL/PLATELET
Basophils Absolute: 0.1 10*3/uL (ref 0.0–0.1)
Basophils Relative: 0.7 % (ref 0.0–3.0)
EOS PCT: 1.6 % (ref 0.0–5.0)
Eosinophils Absolute: 0.2 10*3/uL (ref 0.0–0.7)
HCT: 37.2 % (ref 36.0–46.0)
Hemoglobin: 12.6 g/dL (ref 12.0–15.0)
Lymphocytes Relative: 23.2 % (ref 12.0–46.0)
Lymphs Abs: 2.4 10*3/uL (ref 0.7–4.0)
MCHC: 33.8 g/dL (ref 30.0–36.0)
MCV: 91 fl (ref 78.0–100.0)
MONO ABS: 0.5 10*3/uL (ref 0.1–1.0)
MONOS PCT: 5 % (ref 3.0–12.0)
Neutro Abs: 7.2 10*3/uL (ref 1.4–7.7)
Neutrophils Relative %: 69.5 % (ref 43.0–77.0)
Platelets: 309 10*3/uL (ref 150.0–400.0)
RBC: 4.09 Mil/uL (ref 3.87–5.11)
RDW: 12.8 % (ref 11.5–15.5)
WBC: 10.4 10*3/uL (ref 4.0–10.5)

## 2014-09-03 LAB — LIPID PANEL
CHOLESTEROL: 179 mg/dL (ref 0–200)
HDL: 51.4 mg/dL (ref >39.00)
LDL Cholesterol: 101 mg/dL — ABNORMAL HIGH (ref 0–99)
NonHDL: 127.6
Total CHOL/HDL Ratio: 3
Triglycerides: 132 mg/dL (ref 0.0–149.0)
VLDL: 26.4 mg/dL (ref 0.0–40.0)

## 2014-09-03 LAB — TSH: TSH: 1.88 u[IU]/mL (ref 0.35–4.50)

## 2014-09-10 ENCOUNTER — Ambulatory Visit (INDEPENDENT_AMBULATORY_CARE_PROVIDER_SITE_OTHER): Payer: BLUE CROSS/BLUE SHIELD | Admitting: Family Medicine

## 2014-09-10 ENCOUNTER — Other Ambulatory Visit (HOSPITAL_COMMUNITY)
Admission: RE | Admit: 2014-09-10 | Discharge: 2014-09-10 | Disposition: A | Discharge status: Home / Self Care | Payer: BLUE CROSS/BLUE SHIELD | Source: Ambulatory Visit | Attending: Family Medicine | Admitting: Family Medicine

## 2014-09-10 ENCOUNTER — Encounter: Payer: Self-pay | Admitting: Family Medicine

## 2014-09-10 VITALS — BP 124/74 | HR 76 | Temp 98.0°F | Ht 64.0 in | Wt 179.5 lb

## 2014-09-10 DIAGNOSIS — J309 Allergic rhinitis, unspecified: Secondary | ICD-10-CM

## 2014-09-10 DIAGNOSIS — Z113 Encounter for screening for infections with a predominantly sexual mode of transmission: Secondary | ICD-10-CM | POA: Insufficient documentation

## 2014-09-10 DIAGNOSIS — M6248 Contracture of muscle, other site: Secondary | ICD-10-CM | POA: Diagnosis not present

## 2014-09-10 DIAGNOSIS — Z1151 Encounter for screening for human papillomavirus (HPV): Secondary | ICD-10-CM | POA: Diagnosis present

## 2014-09-10 DIAGNOSIS — Z1239 Encounter for other screening for malignant neoplasm of breast: Secondary | ICD-10-CM

## 2014-09-10 DIAGNOSIS — Z01419 Encounter for gynecological examination (general) (routine) without abnormal findings: Secondary | ICD-10-CM | POA: Diagnosis not present

## 2014-09-10 DIAGNOSIS — Z0000 Encounter for general adult medical examination without abnormal findings: Secondary | ICD-10-CM

## 2014-09-10 DIAGNOSIS — N760 Acute vaginitis: Secondary | ICD-10-CM | POA: Diagnosis present

## 2014-09-10 DIAGNOSIS — M62838 Other muscle spasm: Secondary | ICD-10-CM

## 2014-09-10 MED ORDER — CYCLOBENZAPRINE HCL 5 MG PO TABS: 5.0000 mg | ORAL_TABLET | Freq: Every evening | ORAL | Status: DC | PRN

## 2014-09-10 NOTE — Progress Notes (Signed)
Pre visit review using our clinic review tool, if applicable. No additional management support is needed unless otherwise documented below in the visit note. 

## 2014-09-10 NOTE — Patient Instructions (Signed)
Great to see you. Please call to schedule your mammogram. 

## 2014-09-10 NOTE — Assessment & Plan Note (Signed)
Discussed USPSTF recommendations of cervical cancer screening.  She is aware that interval of 3 years is recommended but pt would prefer to have pap smear done today.  

## 2014-09-10 NOTE — Assessment & Plan Note (Signed)
New- advised heat, ice massage. eRx sent for prn flexeril- discussed sedation precautions.

## 2014-09-10 NOTE — Progress Notes (Addendum)
Subjective:   Patient ID: Teresa Bauer, female    DOB: July 26, 1970, 44 y.o.   MRN: [id number]  Teresa Bauer is a pleasant 44 y.o. year old female who presents to clinic today with Annual Exam  and neck pain on 09/10/2014  HPI:  G2P1- no h/o abnormal pap smears in past 5 years. Last pap done by me on 09/07/13.  Overdue for mammogram. Maternal aunt - + breast CA in 30s. Two paternal cousins recently diagnosed with breast CA- she is not sure if genetic testing done.  Neck pain- past two days, hurts when she turns her head to left side or touches her chin to her chest.  No visual changes.  No known injury.  No UE radiculopathy or weakness.  Lab Results  Component Value Date   CHOL 179 09/03/2014   HDL 51.40 09/03/2014   LDLCALC 101* 09/03/2014   TRIG 132.0 09/03/2014   CHOLHDL 3 09/03/2014   Lab Results  Component Value Date   CREATININE 0.84 09/03/2014   Lab Results  Component Value Date   TSH 1.88 09/03/2014   Lab Results  Component Value Date   WBC 10.4 09/03/2014   HGB 12.6 09/03/2014   HCT 37.2 09/03/2014   MCV 91.0 09/03/2014   PLT 309.0 09/03/2014   Lab Results  Component Value Date   ALT 12 09/03/2014   AST 13 09/03/2014   ALKPHOS 67 09/03/2014   BILITOT 0.3 09/03/2014     Current Outpatient Prescriptions on File Prior to Visit  Medication Sig Dispense Refill  . fluticasone (FLONASE) 50 MCG/ACT nasal spray Place 2 sprays into both nostrils daily. 16 g 6  . loratadine-pseudoephedrine (CLARITIN-D 24-HOUR) 10-240 MG per 24 hr tablet Take 1 tablet by mouth daily.    Marland Kitchen triamcinolone cream (KENALOG) 0.1 % Apply 1 application topically 2 (two) times daily. Apply to AA. 30 g 0   No current facility-administered medications on file prior to visit.    Allergies  Allergen Reactions  . Aspirin     Past Medical History  Diagnosis Date  . Headaches, cluster     Past Surgical History  Procedure Laterality Date  . Miscarriage  2009  . Knee surgery  2013   L knee  . Tonsillectomy  1988  . Wisdom tooth extraction  1991-92    Family History  Problem Relation Age of Onset  . Heart disease Mother   . Diabetes Mother   . Hypertension Mother   . Heart disease Father   . Hypertension Father   . Hypertension Sister   . Asthma Sister   . Hypertension Brother   . Heart disease Maternal Grandmother     History   Social History  . Marital Status: Married    Spouse Name: N/A  . Number of Children: N/A  . Years of Education: N/A   Occupational History  . Not on file.   Social History Main Topics  . Smoking status: Never Smoker   . Smokeless tobacco: Never Used  . Alcohol Use: No  . Drug Use: No  . Sexual Activity: Yes   Other Topics Concern  . Not on file   Social History Narrative   Married.   Works at [employer].   One son- 69, 56 month old grand daughter named Lyndee Leo.   The PMH, PSH, Social History, Family History, Medications, and allergies have been reviewed in [facility], and have been updated if relevant.    Review of Systems  Constitutional: Negative.  HENT: Negative.   Respiratory: Negative.   Cardiovascular: Negative.   Gastrointestinal: Negative.   Endocrine: Negative.   Genitourinary: Negative.   Musculoskeletal: Positive for neck pain and neck stiffness.  Skin: Negative.   Allergic/Immunologic: Negative.   Neurological: Negative.   Hematological: Negative.   Psychiatric/Behavioral: Negative.   All other systems reviewed and are negative.   Wt Readings from Last 3 Encounters:  09/10/14 179 lb 8 oz (81.421 kg)  08/02/14 183 lb 8 oz (83.235 kg)  11/01/13 181 lb 4 oz (82.214 kg)    Current Outpatient Prescriptions on File Prior to Visit  Medication Sig Dispense Refill  . fluticasone (FLONASE) 50 MCG/ACT nasal spray Place 2 sprays into both nostrils daily. 16 g 6  . loratadine-pseudoephedrine (CLARITIN-D 24-HOUR) 10-240 MG per 24 hr tablet Take 1 tablet by mouth daily.    Marland Kitchen triamcinolone cream (KENALOG) 0.1 %  Apply 1 application topically 2 (two) times daily. Apply to AA. 30 g 0   No current facility-administered medications on file prior to visit.    Allergies  Allergen Reactions  . Aspirin     Past Medical History  Diagnosis Date  . Headaches, cluster     Past Surgical History  Procedure Laterality Date  . Miscarriage  2009  . Knee surgery  2013    L knee  . Tonsillectomy  1988  . Wisdom tooth extraction  1991-92    Family History  Problem Relation Age of Onset  . Heart disease Mother   . Diabetes Mother   . Hypertension Mother   . Heart disease Father   . Hypertension Father   . Hypertension Sister   . Asthma Sister   . Hypertension Brother   . Heart disease Maternal Grandmother     History   Social History  . Marital Status: Married    Spouse Name: N/A  . Number of Children: N/A  . Years of Education: N/A   Occupational History  . Not on file.   Social History Main Topics  . Smoking status: Never Smoker   . Smokeless tobacco: Never Used  . Alcohol Use: No  . Drug Use: No  . Sexual Activity: Yes   Other Topics Concern  . Not on file   Social History Narrative   Married.   Works at [employer].   One son- 79, 57 month old grand daughter named Lyndee Leo.   The PMH, PSH, Social History, Family History, Medications, and allergies have been reviewed in [facility], and have been updated if relevant.  Objective:    BP 124/74 mmHg  Pulse 76  Temp(Src) 98 F (36.7 C) (Oral)  Ht 5\' 4"  (1.626 m)  Wt 179 lb 8 oz (81.421 kg)  BMI 30.80 kg/m2  SpO2 98%  LMP 08/19/2014 (Within Days)   Physical Exam   General:  Well-developed,well-nourished,in no acute distress; alert,appropriate and cooperative throughout examination Head:  normocephalic and atraumatic.   Eyes:  vision grossly intact, pupils equal, pupils round, and pupils reactive to light.   Ears:  R ear normal and L ear normal.   Nose:  no external deformity.   Mouth:  good dentition.   Neck:  No  deformities, masses, or tenderness noted. Breasts:  No mass, nodules, thickening, tenderness, bulging, retraction, inflamation, nipple discharge or skin changes noted.   Lungs:  Normal respiratory effort, chest expands symmetrically. Lungs are clear to auscultation, no crackles or wheezes. Heart:  Normal rate and regular rhythm. S1 and S2 normal without gallop, murmur, click,  rub or other extra sounds. Abdomen:  Bowel sounds positive,abdomen soft and non-tender without masses, organomegaly or hernias noted. Rectal:  no external abnormalities.   Genitalia:  Pelvic Exam:        External: normal female genitalia without lesions or masses        Vagina: normal without lesions or masses        Cervix: normal without lesions or masses        Adnexa: normal bimanual exam without masses or fullness        Uterus: normal by palpation        Pap smear: performed Msk:  Spasm palpable- left neck with normal grip strength bilaterally Extremities:  No clubbing, cyanosis, edema, or deformity noted with normal full range of motion of all joints.   Neurologic:  alert & oriented X3 and gait normal.   Skin:  Intact without suspicious lesions or rashes Cervical Nodes:  No lymphadenopathy noted Axillary Nodes:  No palpable lymphadenopathy Psych:  Cognition and judgment appear intact. Alert and cooperative with normal attention span and concentration. No apparent delusions, illusions, hallucinations       Assessment & Plan:   Routine general medical examination at a health care facility  Allergic rhinitis, unspecified allergic rhinitis type No Follow-up on file.

## 2014-09-10 NOTE — Addendum Note (Signed)
Addended by: Modena Nunnery on: 09/10/2014 09:42 AM   Modules accepted: Orders

## 2014-09-10 NOTE — Assessment & Plan Note (Signed)
Reviewed preventive care protocols, scheduled due services, and updated immunizations Discussed nutrition, exercise, diet, and healthy lifestyle.  

## 2014-09-10 NOTE — Addendum Note (Signed)
Addended by: Lucille Passy on: 09/10/2014 09:50 AM   Modules accepted: Orders, Level of Service, SmartSet

## 2014-09-11 ENCOUNTER — Encounter: Payer: Self-pay | Admitting: *Deleted

## 2014-09-11 LAB — CYTOLOGY - PAP

## 2014-09-12 LAB — CERVICOVAGINAL ANCILLARY ONLY
Bacterial vaginitis: NEGATIVE
CANDIDA VAGINITIS: NEGATIVE

## 2014-09-13 ENCOUNTER — Ambulatory Visit
Admission: RE | Admit: 2014-09-13 | Discharge: 2014-09-13 | Disposition: A | Discharge status: Home / Self Care | Payer: BLUE CROSS/BLUE SHIELD | Source: Ambulatory Visit | Attending: Family Medicine | Admitting: Family Medicine

## 2014-09-13 DIAGNOSIS — Z1231 Encounter for screening mammogram for malignant neoplasm of breast: Secondary | ICD-10-CM

## 2014-09-17 LAB — CERVICOVAGINAL ANCILLARY ONLY: Herpes: NEGATIVE

## 2014-12-03 ENCOUNTER — Encounter: Payer: Self-pay | Admitting: Family Medicine

## 2014-12-10 ENCOUNTER — Ambulatory Visit: Payer: BLUE CROSS/BLUE SHIELD | Admitting: Family Medicine

## 2014-12-16 ENCOUNTER — Encounter: Payer: Self-pay | Admitting: Family Medicine

## 2014-12-16 ENCOUNTER — Ambulatory Visit (INDEPENDENT_AMBULATORY_CARE_PROVIDER_SITE_OTHER): Payer: BLUE CROSS/BLUE SHIELD | Admitting: Family Medicine

## 2014-12-16 VITALS — BP 118/70 | HR 70 | Temp 97.8°F | Wt 178.8 lb

## 2014-12-16 DIAGNOSIS — N920 Excessive and frequent menstruation with regular cycle: Secondary | ICD-10-CM | POA: Insufficient documentation

## 2014-12-16 NOTE — Progress Notes (Signed)
Subjective:   Patient ID: Teresa Bauer, female    DOB: Dec 05, 1970, 44 y.o.   MRN: [id number]  Teresa Bauer is a pleasant 44 y.o. year old female who presents to clinic today with abnormal periods  on 12/16/2014  HPI:  Last month, two days of her period were very heavy with cramping. Passed two very large blot clots.  Not currently spotting and periods of been otherwise regular.  Denies hot flashes, insomnia, vaginal dryness or mood swings.  Current Outpatient Prescriptions on File Prior to Visit  Medication Sig Dispense Refill  . cyclobenzaprine (FLEXERIL) 5 MG tablet Take 1 tablet (5 mg total) by mouth at bedtime as needed for muscle spasms. 30 tablet 1  . fluticasone (FLONASE) 50 MCG/ACT nasal spray Place 2 sprays into both nostrils daily. 16 g 6  . loratadine-pseudoephedrine (CLARITIN-D 24-HOUR) 10-240 MG per 24 hr tablet Take 1 tablet by mouth daily.    Marland Kitchen triamcinolone cream (KENALOG) 0.1 % Apply 1 application topically 2 (two) times daily. Apply to AA. 30 g 0   No current facility-administered medications on file prior to visit.    Allergies  Allergen Reactions  . Aspirin     Past Medical History  Diagnosis Date  . Headaches, cluster     Past Surgical History  Procedure Laterality Date  . Miscarriage  2009  . Knee surgery  2013    L knee  . Tonsillectomy  1988  . Wisdom tooth extraction  1991-92    Family History  Problem Relation Age of Onset  . Heart disease Mother   . Diabetes Mother   . Hypertension Mother   . Heart disease Father   . Hypertension Father   . Hypertension Sister   . Asthma Sister   . Hypertension Brother   . Heart disease Maternal Grandmother   . COPD Paternal Aunt 72    breast CA    Social History   Social History  . Marital Status: Married    Spouse Name: N/A  . Number of Children: N/A  . Years of Education: N/A   Occupational History  . Not on file.   Social History Main Topics  . Smoking status: Never Smoker   .  Smokeless tobacco: Never Used  . Alcohol Use: No  . Drug Use: No  . Sexual Activity: Yes   Other Topics Concern  . Not on file   Social History Narrative   Married.   Works at [employer].   One son- 29, 7 month old grand daughter named Teresa Bauer.   The PMH, PSH, Social History, Family History, Medications, and allergies have been reviewed in [facility], and have been updated if relevant.   Lab Results  Component Value Date   WBC 10.4 09/03/2014   HGB 12.6 09/03/2014   HCT 37.2 09/03/2014   MCV 91.0 09/03/2014   PLT 309.0 09/03/2014     Review of Systems  Constitutional: Negative.   Respiratory: Negative.   Cardiovascular: Negative.   Gastrointestinal: Negative.   Endocrine: Negative.   Genitourinary: Positive for vaginal bleeding and menstrual problem. Negative for urgency, decreased urine volume, vaginal discharge, vaginal pain and pelvic pain.  Musculoskeletal: Negative.   Psychiatric/Behavioral: Negative.   All other systems reviewed and are negative.      Objective:    BP 118/70 mmHg  Pulse 70  Temp(Src) 97.8 F (36.6 C) (Oral)  Wt 178 lb 12 oz (81.08 kg)  SpO2 98%  LMP 12/02/2014   Physical Exam  Constitutional: She is oriented to person, place, and time. She appears well-developed and well-nourished. No distress.  HENT:  Head: Normocephalic.  Eyes: Conjunctivae are normal.  Cardiovascular: Normal rate.   Pulmonary/Chest: Effort normal.  Musculoskeletal: Normal range of motion. She exhibits no edema.  Neurological: She is alert and oriented to person, place, and time. No cranial nerve deficit.  Skin: Skin is warm and dry.  Psychiatric: She has a normal mood and affect. Her behavior is normal. Judgment and thought content normal.  Nursing note and vitals reviewed.         Assessment & Plan:   Menorrhagia with regular cycle No Follow-up on file.

## 2014-12-16 NOTE — Assessment & Plan Note (Signed)
New-  ?possibly early miscarriage vs fibroid bleeding vs perimenopause Currently asymptomatic. Transvaginal/pelvic US today for further evaluation. The patient indicates understanding of these issues and agrees with the plan.

## 2014-12-16 NOTE — Progress Notes (Signed)
Pre visit review using our clinic review tool, if applicable. No additional management support is needed unless otherwise documented below in the visit note. 

## 2014-12-16 NOTE — Patient Instructions (Signed)
Good to see you. Please stop by to see Teresa Bauer on your way out. 

## 2014-12-19 ENCOUNTER — Ambulatory Visit
Admission: RE | Admit: 2014-12-19 | Discharge: 2014-12-19 | Disposition: A | Discharge status: Home / Self Care | Payer: BLUE CROSS/BLUE SHIELD | Source: Ambulatory Visit | Attending: Family Medicine | Admitting: Family Medicine

## 2014-12-19 DIAGNOSIS — N920 Excessive and frequent menstruation with regular cycle: Secondary | ICD-10-CM

## 2014-12-20 ENCOUNTER — Other Ambulatory Visit: Payer: Self-pay | Admitting: Family Medicine

## 2014-12-20 DIAGNOSIS — D259 Leiomyoma of uterus, unspecified: Secondary | ICD-10-CM

## 2015-10-29 ENCOUNTER — Encounter: Payer: Self-pay | Admitting: Family Medicine

## 2015-12-23 ENCOUNTER — Ambulatory Visit (INDEPENDENT_AMBULATORY_CARE_PROVIDER_SITE_OTHER)
Admission: RE | Admit: 2015-12-23 | Discharge: 2015-12-23 | Disposition: A | Discharge status: Home / Self Care | Payer: BLUE CROSS/BLUE SHIELD | Source: Ambulatory Visit | Attending: Family Medicine | Admitting: Family Medicine

## 2015-12-23 ENCOUNTER — Ambulatory Visit (INDEPENDENT_AMBULATORY_CARE_PROVIDER_SITE_OTHER): Payer: BLUE CROSS/BLUE SHIELD | Admitting: Family Medicine

## 2015-12-23 ENCOUNTER — Encounter: Payer: Self-pay | Admitting: Family Medicine

## 2015-12-23 DIAGNOSIS — M545 Low back pain, unspecified: Secondary | ICD-10-CM | POA: Insufficient documentation

## 2015-12-23 DIAGNOSIS — M79604 Pain in right leg: Secondary | ICD-10-CM | POA: Insufficient documentation

## 2015-12-23 MED ORDER — DEXAMETHASONE SODIUM PHOSPHATE 10 MG/ML IJ SOLN
10.0000 mg | Freq: Once | INTRAMUSCULAR | Status: AC
  Administered 2015-12-23: 10 mg via INTRAMUSCULAR

## 2015-12-23 NOTE — Patient Instructions (Signed)
Great to see you. I will call you with your xray result tomorrow.

## 2015-12-23 NOTE — Progress Notes (Signed)
Pre visit review using our clinic review tool, if applicable. No additional management support is needed unless otherwise documented below in the visit note. 

## 2015-12-23 NOTE — Progress Notes (Signed)
SUBJECTIVE:  Teresa Bauer is a 45 y.o. female who complains of low back pain for 9 month(s), positional with bending or lifting, with radiation down the right leg. Precipitating factors: none recalled by the patient. Prior history of back problems: recurrent self limited episodes of low back pain in the past. There is no numbness in the legs.  Current Outpatient Prescriptions on File Prior to Visit  Medication Sig Dispense Refill  . cyclobenzaprine (FLEXERIL) 5 MG tablet Take 1 tablet (5 mg total) by mouth at bedtime as needed for muscle spasms. 30 tablet 1  . fluticasone (FLONASE) 50 MCG/ACT nasal spray Place 2 sprays into both nostrils daily. 16 g 6  . loratadine-pseudoephedrine (CLARITIN-D 24-HOUR) 10-240 MG per 24 hr tablet Take 1 tablet by mouth daily.    Marland Kitchen triamcinolone cream (KENALOG) 0.1 % Apply 1 application topically 2 (two) times daily. Apply to AA. 30 g 0   No current facility-administered medications on file prior to visit.     Allergies  Allergen Reactions  . Aspirin     Past Medical History:  Diagnosis Date  . Headaches, cluster     Past Surgical History:  Procedure Laterality Date  . KNEE SURGERY  2013   L knee  . miscarriage  2009  . TONSILLECTOMY  1988  . WISDOM TOOTH EXTRACTION  1991-92    Family History  Problem Relation Age of Onset  . Heart disease Mother   . Diabetes Mother   . Hypertension Mother   . Heart disease Father   . Hypertension Father   . Hypertension Sister   . Asthma Sister   . Hypertension Brother   . Heart disease Maternal Grandmother   . COPD Paternal Aunt 48    breast CA    Social History   Social History  . Marital status: Married    Spouse name: N/A  . Number of children: N/A  . Years of education: N/A   Occupational History  . Not on file.   Social History Main Topics  . Smoking status: Never Smoker  . Smokeless tobacco: Never Used  . Alcohol use No  . Drug use: No  . Sexual activity: Yes   Other Topics  Concern  . Not on file   Social History Narrative   Married.   Works at [employer].   One son- 75, 36 month old grand daughter named Lyndee Leo.   The PMH, PSH, Social History, Family History, Medications, and allergies have been reviewed in [facility], and have been updated if relevant.  OBJECTIVE: BP 132/70   Pulse 71   Temp 97.9 F (36.6 C) (Oral)   Wt 188 lb (85.3 kg)   LMP 12/18/2015   SpO2 98%   BMI 32.27 kg/m   BP 132/70   Pulse 71   Temp 97.9 F (36.6 C) (Oral)   Wt 188 lb (85.3 kg)   LMP 12/18/2015   SpO2 98%   BMI 32.27 kg/m   Patient appears to be in mild to moderate pain, antalgic gait noted. Lumbosacral spine area reveals no local tenderness or mass.  Painful and reduced LS ROM noted. Straight leg raise is positive at 90 degrees on right. DTR's, motor strength and sensation normal, including heel and toe gait.  Peripheral pulses are palpable. X-Ray: ordered, but results not yet available.  ASSESSMENT:  herniated disc likely at L4-5 and with radiculopathy  PLAN: IM decadron given for acute inflammation. Ortho referral.

## 2016-01-01 ENCOUNTER — Telehealth: Payer: Self-pay | Admitting: Family Medicine

## 2016-01-01 NOTE — Telephone Encounter (Signed)
Spoke to pt and advised. Pt will contact the office back on Monday with update of status

## 2016-01-01 NOTE — Telephone Encounter (Signed)
Spoke with pt today to give her the appointment for [facility]  It is 12/1.  Pt wanted to know if there was anything else to take besides tylenol.  She stated tylenol is not helping.  I offered to call other offices to get her in sooner pt stated she would wait till 12/1

## 2016-01-01 NOTE — Telephone Encounter (Signed)
She can try alternation 800 mg Ibuprofen every 8 hours with Tylenol 1000 mg every 8 hours. If this not effective after 48 hours, please let us know.

## 2016-02-11 ENCOUNTER — Other Ambulatory Visit: Payer: Self-pay | Admitting: Orthopedic Surgery

## 2016-02-11 DIAGNOSIS — M533 Sacrococcygeal disorders, not elsewhere classified: Secondary | ICD-10-CM

## 2016-02-19 ENCOUNTER — Other Ambulatory Visit: Payer: BLUE CROSS/BLUE SHIELD

## 2016-04-19 ENCOUNTER — Ambulatory Visit (INDEPENDENT_AMBULATORY_CARE_PROVIDER_SITE_OTHER): Payer: BLUE CROSS/BLUE SHIELD | Admitting: Family Medicine

## 2016-04-19 ENCOUNTER — Encounter: Payer: Self-pay | Admitting: Family Medicine

## 2016-04-19 ENCOUNTER — Other Ambulatory Visit (HOSPITAL_COMMUNITY)
Admission: RE | Admit: 2016-04-19 | Discharge: 2016-04-19 | Disposition: A | Discharge status: Home / Self Care | Payer: BLUE CROSS/BLUE SHIELD | Source: Ambulatory Visit | Attending: Family Medicine | Admitting: Family Medicine

## 2016-04-19 VITALS — BP 116/80 | HR 76 | Temp 98.0°F | Ht 64.5 in | Wt 193.0 lb

## 2016-04-19 DIAGNOSIS — Z1231 Encounter for screening mammogram for malignant neoplasm of breast: Secondary | ICD-10-CM

## 2016-04-19 DIAGNOSIS — Z01419 Encounter for gynecological examination (general) (routine) without abnormal findings: Secondary | ICD-10-CM | POA: Diagnosis present

## 2016-04-19 DIAGNOSIS — Z1239 Encounter for other screening for malignant neoplasm of breast: Secondary | ICD-10-CM

## 2016-04-19 DIAGNOSIS — Z1151 Encounter for screening for human papillomavirus (HPV): Secondary | ICD-10-CM | POA: Insufficient documentation

## 2016-04-19 DIAGNOSIS — Z0000 Encounter for general adult medical examination without abnormal findings: Secondary | ICD-10-CM

## 2016-04-19 LAB — TSH: TSH: 1.77 u[IU]/mL (ref 0.35–4.50)

## 2016-04-19 LAB — CBC WITH DIFFERENTIAL/PLATELET
Basophils Absolute: 0.1 10*3/uL (ref 0.0–0.1)
Basophils Relative: 1.1 % (ref 0.0–3.0)
EOS ABS: 0.1 10*3/uL (ref 0.0–0.7)
Eosinophils Relative: 1.4 % (ref 0.0–5.0)
HEMATOCRIT: 39.1 % (ref 36.0–46.0)
Hemoglobin: 13.3 g/dL (ref 12.0–15.0)
LYMPHS PCT: 27 % (ref 12.0–46.0)
Lymphs Abs: 2.4 10*3/uL (ref 0.7–4.0)
MCHC: 33.8 g/dL (ref 30.0–36.0)
MCV: 91.8 fl (ref 78.0–100.0)
MONOS PCT: 6.9 % (ref 3.0–12.0)
Monocytes Absolute: 0.6 10*3/uL (ref 0.1–1.0)
Neutro Abs: 5.6 10*3/uL (ref 1.4–7.7)
Neutrophils Relative %: 63.6 % (ref 43.0–77.0)
Platelets: 321 10*3/uL (ref 150.0–400.0)
RBC: 4.27 Mil/uL (ref 3.87–5.11)
RDW: 13.3 % (ref 11.5–15.5)
WBC: 8.8 10*3/uL (ref 4.0–10.5)

## 2016-04-19 LAB — COMPREHENSIVE METABOLIC PANEL
ALBUMIN: 4 g/dL (ref 3.5–5.2)
ALT: 12 U/L (ref 0–35)
AST: 12 U/L (ref 0–37)
Alkaline Phosphatase: 69 U/L (ref 39–117)
BUN: 15 mg/dL (ref 6–23)
CALCIUM: 9.3 mg/dL (ref 8.4–10.5)
CO2: 25 mEq/L (ref 19–32)
CREATININE: 0.92 mg/dL (ref 0.40–1.20)
Chloride: 105 mEq/L (ref 96–112)
GFR: 69.38 mL/min (ref >60.00)
Glucose, Bld: 102 mg/dL — ABNORMAL HIGH (ref 70–99)
Potassium: 4.3 mEq/L (ref 3.5–5.1)
Sodium: 139 mEq/L (ref 135–145)
Total Bilirubin: 0.3 mg/dL (ref 0.2–1.2)
Total Protein: 7 g/dL (ref 6.0–8.3)

## 2016-04-19 LAB — LIPID PANEL
Cholesterol: 212 mg/dL — ABNORMAL HIGH (ref 0–200)
HDL: 50.3 mg/dL (ref >39.00)
LDL CALC: 136 mg/dL — AB (ref 0–99)
NonHDL: 161.43
TRIGLYCERIDES: 125 mg/dL (ref 0.0–149.0)
Total CHOL/HDL Ratio: 4
VLDL: 25 mg/dL (ref 0.0–40.0)

## 2016-04-19 NOTE — Progress Notes (Signed)
Pre visit review using our clinic review tool, if applicable. No additional management support is needed unless otherwise documented below in the visit note. 

## 2016-04-19 NOTE — Patient Instructions (Signed)
Great to see you. We will call you with your results from today. 

## 2016-04-19 NOTE — Progress Notes (Addendum)
Subjective:   Patient ID: Teresa Bauer, female    DOB: [birth year], 46 y.o.   MRN: [id number]  Teresa Bauer is a pleasant 46 y.o. year old female who presents to clinic today with Annual Exam (Normal Pap in July 2016. )  on 04/19/2016  HPI:  G2P2- no h/o abnormal pap smears in the past 5 years. Last pap smear done by me in 08/2014. Mammogram 09/13/14  She is still having periods, sometimes heavy.  Pretty regular.  Maternal aunt - + breast CA in 4s. Two paternal cousins recently diagnosed with breast CA- she is not sure if genetic testing done.   Lab Results  Component Value Date   CHOL 179 09/03/2014   HDL 51.40 09/03/2014   LDLCALC 101 (H) 09/03/2014   TRIG 132.0 09/03/2014   CHOLHDL 3 09/03/2014   Lab Results  Component Value Date   CREATININE 0.84 09/03/2014   Lab Results  Component Value Date   TSH 1.88 09/03/2014   Lab Results  Component Value Date   WBC 10.4 09/03/2014   HGB 12.6 09/03/2014   HCT 37.2 09/03/2014   MCV 91.0 09/03/2014   PLT 309.0 09/03/2014   Lab Results  Component Value Date   ALT 12 09/03/2014   AST 13 09/03/2014   ALKPHOS 67 09/03/2014   BILITOT 0.3 09/03/2014    Current Outpatient Prescriptions on File Prior to Visit  Medication Sig Dispense Refill  . cyclobenzaprine (FLEXERIL) 5 MG tablet Take 1 tablet (5 mg total) by mouth at bedtime as needed for muscle spasms. 30 tablet 1  . fluticasone (FLONASE) 50 MCG/ACT nasal spray Place 2 sprays into both nostrils daily. 16 g 6  . loratadine-pseudoephedrine (CLARITIN-D 24-HOUR) 10-240 MG per 24 hr tablet Take 1 tablet by mouth daily.     No current facility-administered medications on file prior to visit.     Allergies  Allergen Reactions  . Aspirin     Past Medical History:  Diagnosis Date  . Headaches, cluster     Past Surgical History:  Procedure Laterality Date  . KNEE SURGERY  2013   L knee  . miscarriage  2009  . TONSILLECTOMY  1988  . WISDOM TOOTH EXTRACTION  1991-92     Family History  Problem Relation Age of Onset  . Heart disease Mother   . Diabetes Mother   . Hypertension Mother   . Heart disease Father   . Hypertension Father   . Hypertension Sister   . Asthma Sister   . Hypertension Brother   . Heart disease Maternal Grandmother   . COPD Paternal Aunt 55    breast CA    Social History   Social History  . Marital status: Married    Spouse name: N/A  . Number of children: N/A  . Years of education: N/A   Occupational History  . Not on file.   Social History Main Topics  . Smoking status: Never Smoker  . Smokeless tobacco: Never Used  . Alcohol use No  . Drug use: No  . Sexual activity: Yes   Other Topics Concern  . Not on file   Social History Narrative   Married.   Works at [employer].   One son- 16, 67 month old grand daughter named Lyndee Leo.   The PMH, PSH, Social History, Family History, Medications, and allergies have been reviewed in [facility], and have been updated if relevant.   Review of Systems  Constitutional: Negative.   HENT: Negative.  Eyes: Negative.   Respiratory: Negative.   Cardiovascular: Negative.   Gastrointestinal: Negative.   Endocrine: Negative.   Genitourinary: Negative.   Musculoskeletal: Negative.   Allergic/Immunologic: Negative.   Neurological: Negative.   Hematological: Negative.   Psychiatric/Behavioral: Negative.   All other systems reviewed and are negative.      Objective:    BP 116/80 (BP Location: Left Arm, Patient Position: Sitting, Cuff Size: Normal)   Pulse 76   Temp 98 F (36.7 C) (Oral)   Ht 5' 4.5" (1.638 m)   Wt 193 lb (87.5 kg)   LMP 03/26/2016 (Approximate)   SpO2 98%   BMI 32.62 kg/m   Wt Readings from Last 3 Encounters:  04/19/16 193 lb (87.5 kg)  12/23/15 188 lb (85.3 kg)  12/16/14 178 lb 12 oz (81.1 kg)    Physical Exam    General:  Well-developed,well-nourished,in no acute distress; alert,appropriate and cooperative throughout examination Head:   normocephalic and atraumatic.   Eyes:  vision grossly intact, PERRL Ears:  R ear normal and L ear normal externally, TMs clear bilaterally Nose:  no external deformity.   Mouth:  good dentition.   Neck:  No deformities, masses, or tenderness noted. Breasts:  No mass, nodules, thickening, tenderness, bulging, retraction, inflamation, nipple discharge or skin changes noted.   Lungs:  Normal respiratory effort, chest expands symmetrically. Lungs are clear to auscultation, no crackles or wheezes. Heart:  Normal rate and regular rhythm. S1 and S2 normal without gallop, murmur, click, rub or other extra sounds. Abdomen:  Bowel sounds positive,abdomen soft and non-tender without masses, organomegaly or hernias noted. Rectal:  no external abnormalities.   Genitalia:  Pelvic Exam:        External: normal female genitalia without lesions or masses        Vagina: normal without lesions or masses        Cervix: normal without lesions or masses        Adnexa: normal bimanual exam without masses or fullness        Uterus: normal by palpation        Pap smear: performed Msk:  No deformity or scoliosis noted of thoracic or lumbar spine.   Extremities:  No clubbing, cyanosis, edema, or deformity noted with normal full range of motion of all joints.   Neurologic:  alert & oriented X3 and gait normal.   Skin:  Intact without suspicious lesions or rashes Cervical Nodes:  No lymphadenopathy noted Axillary Nodes:  No palpable lymphadenopathy Psych:  Cognition and judgment appear intact. Alert and cooperative with normal attention span and concentration. No apparent delusions, illusions, hallucinations      Assessment & Plan:   Well woman exam No Follow-up on file.

## 2016-04-19 NOTE — Addendum Note (Signed)
Addended by: Pilar Grammes on: 04/19/2016 08:51 AM   Modules accepted: Orders

## 2016-04-21 LAB — CYTOLOGY - PAP
ADEQUACY: ABSENT
DIAGNOSIS: NEGATIVE
HPV: NOT DETECTED

## 2016-05-14 ENCOUNTER — Encounter: Payer: Self-pay | Admitting: Podiatry

## 2016-05-14 ENCOUNTER — Ambulatory Visit (INDEPENDENT_AMBULATORY_CARE_PROVIDER_SITE_OTHER): Payer: BLUE CROSS/BLUE SHIELD | Admitting: Podiatry

## 2016-05-14 ENCOUNTER — Ambulatory Visit (INDEPENDENT_AMBULATORY_CARE_PROVIDER_SITE_OTHER): Payer: BLUE CROSS/BLUE SHIELD

## 2016-05-14 VITALS — BP 119/72 | HR 75 | Resp 16 | Ht 64.5 in | Wt 185.0 lb

## 2016-05-14 DIAGNOSIS — M722 Plantar fascial fibromatosis: Secondary | ICD-10-CM | POA: Diagnosis not present

## 2016-05-14 DIAGNOSIS — M79671 Pain in right foot: Secondary | ICD-10-CM | POA: Diagnosis not present

## 2016-05-14 MED ORDER — TRIAMCINOLONE ACETONIDE 10 MG/ML IJ SUSP
10.0000 mg | Freq: Once | INTRAMUSCULAR | Status: AC
  Administered 2016-05-14: 10 mg

## 2016-05-14 NOTE — Progress Notes (Signed)
   Subjective:    Patient ID: Teresa Bauer, female    DOB: [birth year], 46 y.o.   MRN: [id number]  HPI  Chief Complaint  Patient presents with  . Foot Pain    Right foot; arch; pt stated, "Hurts when walking"     Review of Systems  HENT: Positive for sinus pain.   Musculoskeletal: Positive for back pain and gait problem.  Hematological: Bruises/bleeds easily.  All other systems reviewed and are negative.      Objective:   Physical Exam        Assessment & Plan:

## 2016-05-14 NOTE — Progress Notes (Signed)
Subjective:     Patient ID: Teresa Bauer, female   DOB: 1970/04/05, 46 y.o.   MRN: [id number]  HPI patient states that she was running around a month ago and she felt a strain in her right arch and it's been painful ever since and she's limping and developing other pains   Review of Systems  All other systems reviewed and are negative.      Objective:   Physical Exam  Constitutional: She is oriented to person, place, and time.  Cardiovascular: Intact distal pulses.   Musculoskeletal: Normal range of motion.  Neurological: She is oriented to person, place, and time.  Skin: Skin is warm.  Nursing note and vitals reviewed.  neurovascular status found to be intact with muscle strength adequate range of motion within normal limits with patient found to have exquisite discomfort in the mid and distal mid of the plantar fascial right with no indication or rupture when compared other foot. Patient's noted to have good digital perfusion well oriented 3 with no current depression of the arch     Assessment:     Acute inflammatory condition of the right plantar fascia mid arch area    Plan:     H&P condition reviewed and careful mid arch and  distal injection performed with 3 mg Kenalog 5 mg Xylocaine and applied fascial taping to support the arch and take stress off it. Gave instructions on physical therapy and reappoint 2 weeks  X-ray indicates there is a small heel spur with no indication of stress fracture arthritis or other bone pathology

## 2016-05-14 NOTE — Patient Instructions (Signed)

## 2016-05-28 ENCOUNTER — Ambulatory Visit: Payer: BLUE CROSS/BLUE SHIELD | Admitting: Podiatry

## 2016-07-21 ENCOUNTER — Encounter: Payer: Self-pay | Admitting: Family Medicine

## 2016-08-26 ENCOUNTER — Ambulatory Visit
Admission: RE | Admit: 2016-08-26 | Discharge: 2016-08-26 | Disposition: A | Discharge status: Home / Self Care | Payer: BLUE CROSS/BLUE SHIELD | Source: Ambulatory Visit | Attending: Family Medicine | Admitting: Family Medicine

## 2016-08-26 DIAGNOSIS — Z1239 Encounter for other screening for malignant neoplasm of breast: Secondary | ICD-10-CM

## 2017-07-19 ENCOUNTER — Ambulatory Visit (INDEPENDENT_AMBULATORY_CARE_PROVIDER_SITE_OTHER): Payer: BLUE CROSS/BLUE SHIELD | Admitting: Family Medicine

## 2017-07-19 VITALS — BP 124/78 | HR 83 | Temp 98.8°F | Ht 64.5 in | Wt 195.0 lb

## 2017-07-19 DIAGNOSIS — Z1239 Encounter for other screening for malignant neoplasm of breast: Secondary | ICD-10-CM

## 2017-07-19 DIAGNOSIS — Z0000 Encounter for general adult medical examination without abnormal findings: Secondary | ICD-10-CM

## 2017-07-19 DIAGNOSIS — Z1231 Encounter for screening mammogram for malignant neoplasm of breast: Secondary | ICD-10-CM | POA: Diagnosis not present

## 2017-07-19 LAB — COMPREHENSIVE METABOLIC PANEL
ALT: 11 U/L (ref 0–35)
AST: 12 U/L (ref 0–37)
Albumin: 4.2 g/dL (ref 3.5–5.2)
Alkaline Phosphatase: 74 U/L (ref 39–117)
BUN: 16 mg/dL (ref 6–23)
CALCIUM: 9.7 mg/dL (ref 8.4–10.5)
CHLORIDE: 103 meq/L (ref 96–112)
CO2: 27 mEq/L (ref 19–32)
CREATININE: 0.94 mg/dL (ref 0.40–1.20)
GFR: 67.33 mL/min (ref >60.00)
Glucose, Bld: 95 mg/dL (ref 70–99)
Potassium: 4.9 mEq/L (ref 3.5–5.1)
Sodium: 139 mEq/L (ref 135–145)
Total Bilirubin: 0.4 mg/dL (ref 0.2–1.2)
Total Protein: 7 g/dL (ref 6.0–8.3)

## 2017-07-19 LAB — CBC WITH DIFFERENTIAL/PLATELET
BASOS PCT: 0.8 % (ref 0.0–3.0)
Basophils Absolute: 0.1 10*3/uL (ref 0.0–0.1)
Eosinophils Absolute: 0.1 10*3/uL (ref 0.0–0.7)
Eosinophils Relative: 1.5 % (ref 0.0–5.0)
HCT: 40.2 % (ref 36.0–46.0)
Hemoglobin: 13.4 g/dL (ref 12.0–15.0)
LYMPHS PCT: 26.2 % (ref 12.0–46.0)
Lymphs Abs: 2.6 10*3/uL (ref 0.7–4.0)
MCHC: 33.4 g/dL (ref 30.0–36.0)
MCV: 91 fl (ref 78.0–100.0)
Monocytes Absolute: 0.5 10*3/uL (ref 0.1–1.0)
Monocytes Relative: 5.3 % (ref 3.0–12.0)
NEUTROS ABS: 6.5 10*3/uL (ref 1.4–7.7)
Neutrophils Relative %: 66.2 % (ref 43.0–77.0)
PLATELETS: 326 10*3/uL (ref 150.0–400.0)
RBC: 4.42 Mil/uL (ref 3.87–5.11)
RDW: 13.6 % (ref 11.5–15.5)
WBC: 9.8 10*3/uL (ref 4.0–10.5)

## 2017-07-19 LAB — TSH: TSH: 1.3 u[IU]/mL (ref 0.35–4.50)

## 2017-07-19 LAB — LIPID PANEL
Cholesterol: 237 mg/dL — ABNORMAL HIGH (ref 0–200)
HDL: 52.2 mg/dL (ref >39.00)
LDL Cholesterol: 154 mg/dL — ABNORMAL HIGH (ref 0–99)
NONHDL: 185.08
Total CHOL/HDL Ratio: 5
Triglycerides: 155 mg/dL — ABNORMAL HIGH (ref 0.0–149.0)
VLDL: 31 mg/dL (ref 0.0–40.0)

## 2017-07-19 MED ORDER — CYCLOBENZAPRINE HCL 5 MG PO TABS: 5.0000 mg | ORAL_TABLET | Freq: Every evening | ORAL | 1 refills | Status: DC | PRN

## 2017-07-19 NOTE — Patient Instructions (Signed)
Great to see you. I will call you with your lab results from today and you can view them online.   Please call the breast center at [phone] to schedule your mammogram.  

## 2017-07-19 NOTE — Progress Notes (Signed)
Subjective:   Patient ID: Teresa Bauer, female    DOB: 1970-04-18, 47 y.o.   MRN: [id number]  Teresa Bauer is a pleasant 47 y.o. year old female who presents to clinic today with Annual Exam (CPE no PAP (last one was 3.5.18) . Fasting)  on 07/19/2017  HPI:  G2P2- no h/o abnormal pap smears in the past 5 years. Last pap smear done by me on 04/19/16. Mammogram 08/26/16.  She is still having periods, sometimes heavy.  Pretty regular.  Maternal aunt - + breast CA in 13s. Two paternal cousins recently diagnosed with breast CA- she is not sure if genetic testing done.   Lab Results  Component Value Date   CHOL 212 (H) 04/19/2016   HDL 50.30 04/19/2016   LDLCALC 136 (H) 04/19/2016   TRIG 125.0 04/19/2016   CHOLHDL 4 04/19/2016   Lab Results  Component Value Date   CREATININE 0.92 04/19/2016   Lab Results  Component Value Date   TSH 1.77 04/19/2016   Lab Results  Component Value Date   WBC 8.8 04/19/2016   HGB 13.3 04/19/2016   HCT 39.1 04/19/2016   MCV 91.8 04/19/2016   PLT 321.0 04/19/2016   Lab Results  Component Value Date   ALT 12 04/19/2016   AST 12 04/19/2016   ALKPHOS 69 04/19/2016   BILITOT 0.3 04/19/2016    Current Outpatient Medications on File Prior to Visit  Medication Sig Dispense Refill  . fluticasone (FLONASE) 50 MCG/ACT nasal spray Place 2 sprays into both nostrils daily. 16 g 6  . loratadine-pseudoephedrine (CLARITIN-D 24-HOUR) 10-240 MG per 24 hr tablet Take 1 tablet by mouth daily.    . cyclobenzaprine (FLEXERIL) 5 MG tablet Take 1 tablet (5 mg total) by mouth at bedtime as needed for muscle spasms. (Patient not taking: Reported on 07/19/2017) 30 tablet 1  . diazepam (VALIUM) 5 MG tablet     . norgestrel-ethinyl estradiol (LOW-OGESTREL) 0.3-30 MG-MCG tablet Take by mouth.     No current facility-administered medications on file prior to visit.     Allergies  Allergen Reactions  . Aspirin   . Penicillin G Rash    Past Medical History:    Diagnosis Date  . Headaches, cluster     Past Surgical History:  Procedure Laterality Date  . KNEE SURGERY  2013   L knee  . miscarriage  2009  . TONSILLECTOMY  1988  . WISDOM TOOTH EXTRACTION  1991-92    Family History  Problem Relation Age of Onset  . Heart disease Mother   . Diabetes Mother   . Hypertension Mother   . Heart disease Father   . Hypertension Father   . Hypertension Sister   . Asthma Sister   . Hypertension Brother   . Heart disease Maternal Grandmother   . COPD Paternal Aunt 17       breast CA  . Breast cancer Maternal Aunt     Social History   Socioeconomic History  . Marital status: Married    Spouse name: Not on file  . Number of children: Not on file  . Years of education: Not on file  . Highest education level: Not on file  Occupational History  . Not on file  Social Needs  . Financial resource strain: Not on file  . Food insecurity:    Worry: Not on file    Inability: Not on file  . Transportation needs:    Medical: Not on file  Non-medical: Not on file  Tobacco Use  . Smoking status: Never Smoker  . Smokeless tobacco: Never Used  Substance and Sexual Activity  . Alcohol use: No  . Drug use: No  . Sexual activity: Yes  Lifestyle  . Physical activity:    Days per week: Not on file    Minutes per session: Not on file  . Stress: Not on file  Relationships  . Social connections:    Talks on phone: Not on file    Gets together: Not on file    Attends religious service: Not on file    Active member of club or organization: Not on file    Attends meetings of clubs or organizations: Not on file    Relationship status: Not on file  . Intimate partner violence:    Fear of current or ex partner: Not on file    Emotionally abused: Not on file    Physically abused: Not on file    Forced sexual activity: Not on file  Other Topics Concern  . Not on file  Social History Narrative   Married.   Works at [employer].   One son- 89, 87  month old grand daughter named Lyndee Leo.   The PMH, PSH, Social History, Family History, Medications, and allergies have been reviewed in [facility], and have been updated if relevant.   Review of Systems  Constitutional: Negative.   HENT: Negative.   Eyes: Negative.   Respiratory: Negative.   Cardiovascular: Negative.   Gastrointestinal: Negative.   Endocrine: Negative.   Genitourinary: Negative.   Musculoskeletal: Negative.   Allergic/Immunologic: Negative.   Neurological: Negative.   Hematological: Negative.   Psychiatric/Behavioral: Negative.   All other systems reviewed and are negative.      Objective:    BP 124/78 (BP Location: Right Arm, Patient Position: Sitting, Cuff Size: Normal)   Pulse 83   Temp 98.8 F (37.1 C) (Oral)   Ht 5' 4.5" (1.638 m)   Wt 195 lb (88.5 kg)   SpO2 96%   BMI 32.95 kg/m   Wt Readings from Last 3 Encounters:  07/19/17 195 lb (88.5 kg)  05/14/16 185 lb (83.9 kg)  04/19/16 193 lb (87.5 kg)    Physical Exam     General:  Well-developed,well-nourished,in no acute distress; alert,appropriate and cooperative throughout examination Head:  normocephalic and atraumatic.   Eyes:  vision grossly intact, PERRL Ears:  R ear normal and L ear normal externally, TMs clear bilaterally Nose:  no external deformity.   Mouth:  good dentition.   Neck:  No deformities, masses, or tenderness noted. Breasts:  No mass, nodules, thickening, tenderness, bulging, retraction, inflamation, nipple discharge or skin changes noted.   Lungs:  Normal respiratory effort, chest expands symmetrically. Lungs are clear to auscultation, no crackles or wheezes. Heart:  Normal rate and regular rhythm. S1 and S2 normal without gallop, murmur, click, rub or other extra sounds. Abdomen:  Bowel sounds positive,abdomen soft and non-tender without masses, organomegaly or hernias noted. Msk:  No deformity or scoliosis noted of thoracic or lumbar spine.   Extremities:  No clubbing,  cyanosis, edema, or deformity noted with normal full range of motion of all joints.   Neurologic:  alert & oriented X3 and gait normal.   Skin:  Intact without suspicious lesions or rashes Cervical Nodes:  No lymphadenopathy noted Axillary Nodes:  No palpable lymphadenopathy Psych:  Cognition and judgment appear intact. Alert and cooperative with normal attention span and concentration. No apparent delusions,  illusions, hallucinations      Assessment & Plan:   Well woman exam without gynecological exam No follow-ups on file.

## 2017-07-19 NOTE — Assessment & Plan Note (Signed)
Reviewed preventive care protocols, scheduled due services, and updated immunizations Discussed nutrition, exercise, diet, and healthy lifestyle.  Orders Placed This Encounter  Procedures  . MM Digital Screening  . CBC with Differential/Platelet  . Comprehensive metabolic panel  . Lipid panel  . TSH    

## 2017-07-20 ENCOUNTER — Encounter: Payer: Self-pay | Admitting: Family Medicine

## 2017-07-21 ENCOUNTER — Other Ambulatory Visit: Payer: Self-pay | Admitting: Family Medicine

## 2017-07-21 MED ORDER — ROSUVASTATIN CALCIUM 5 MG PO TABS: 5.0000 mg | ORAL_TABLET | Freq: Every day | ORAL | 3 refills | Status: DC

## 2017-07-21 MED ORDER — SIMVASTATIN 20 MG PO TABS: 20.0000 mg | ORAL_TABLET | Freq: Every day | ORAL | 3 refills | Status: DC

## 2017-07-21 NOTE — Telephone Encounter (Signed)
eRx sent to pharmacy on file.  Please schedule pt for lipid panel and CMET in 8 weeks.

## 2018-05-03 IMAGING — DX DG LUMBAR SPINE COMPLETE 4+V
5 series · 5 of 5 positions shown · non-contrast
Comparison: None.

CLINICAL DATA: Low back pain

EXAM:
LUMBAR SPINE - COMPLETE 4+ VIEW

[l-spine ap]
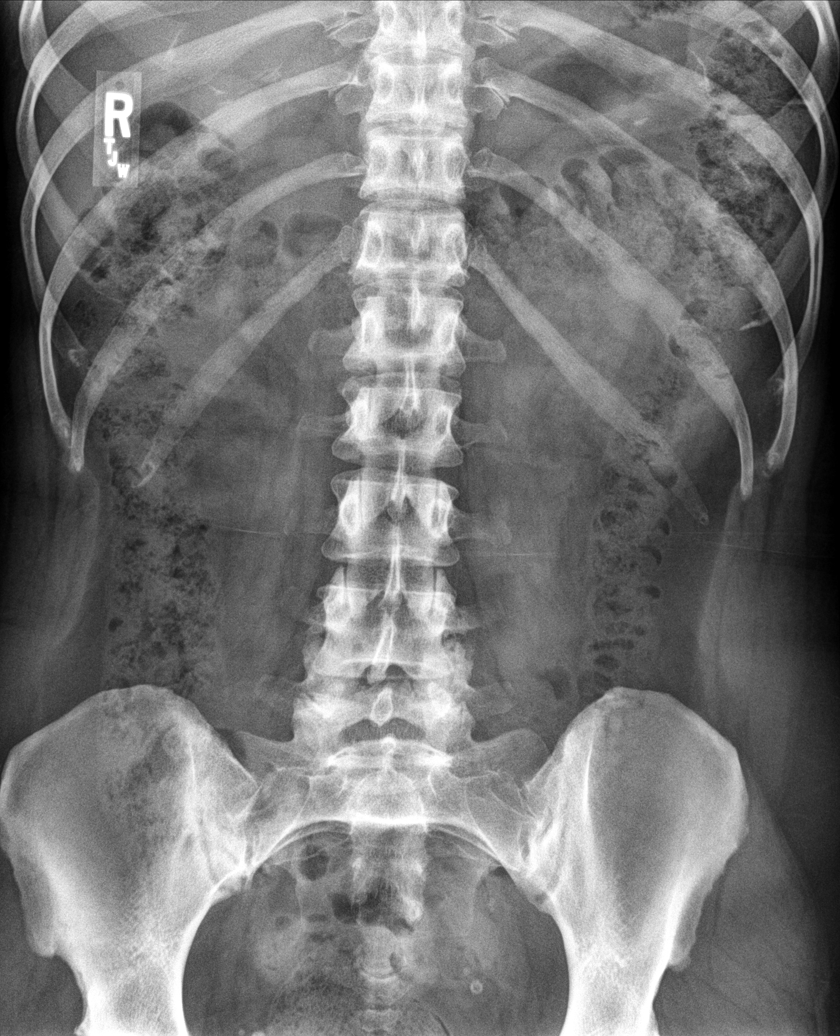

[l-spine obl (1 of 2)]
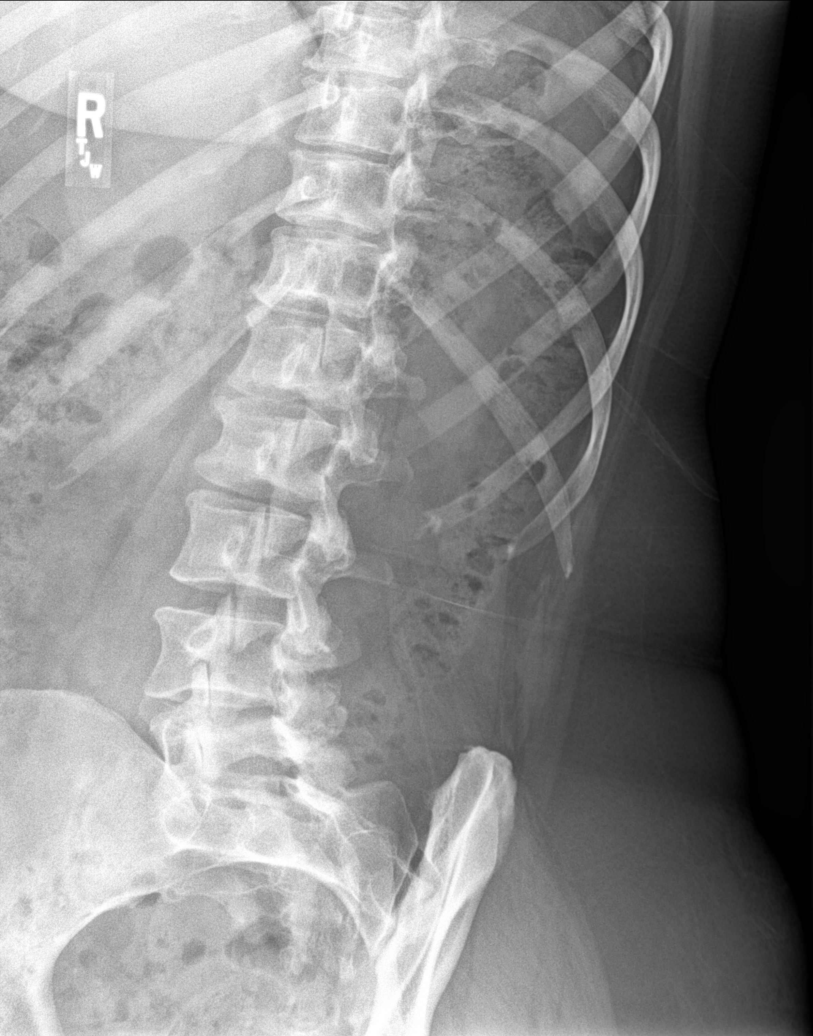

[l-spine obl (2 of 2)]
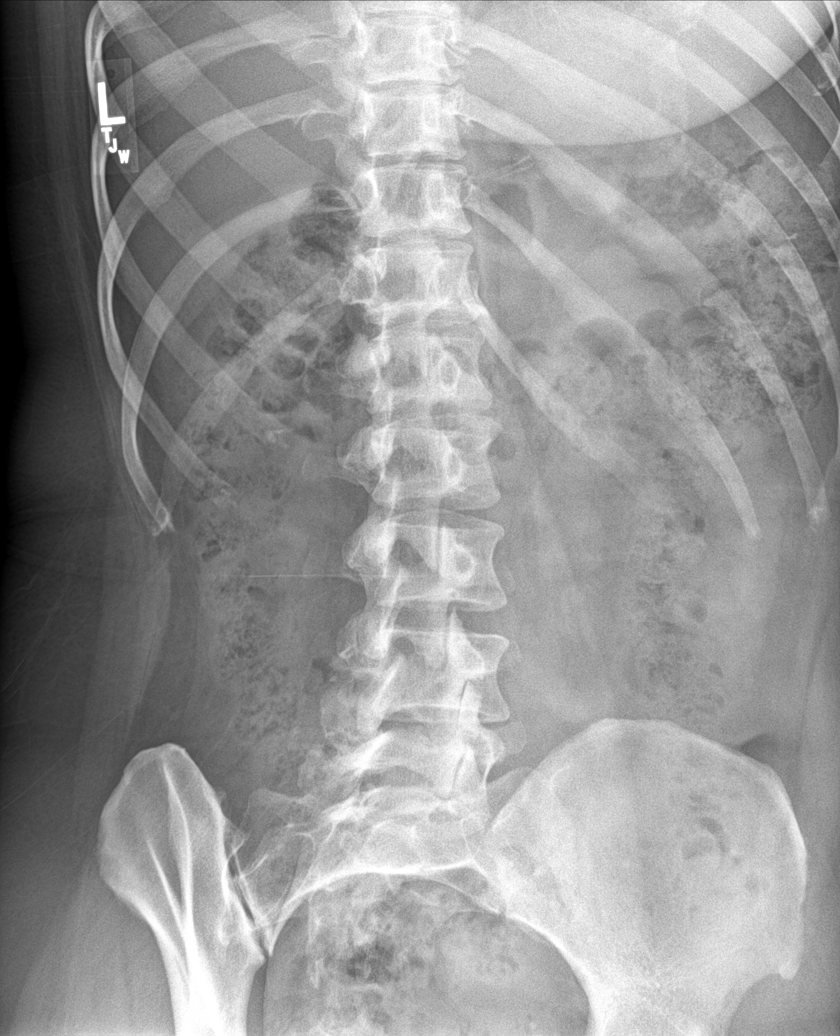

[l-spine lat]
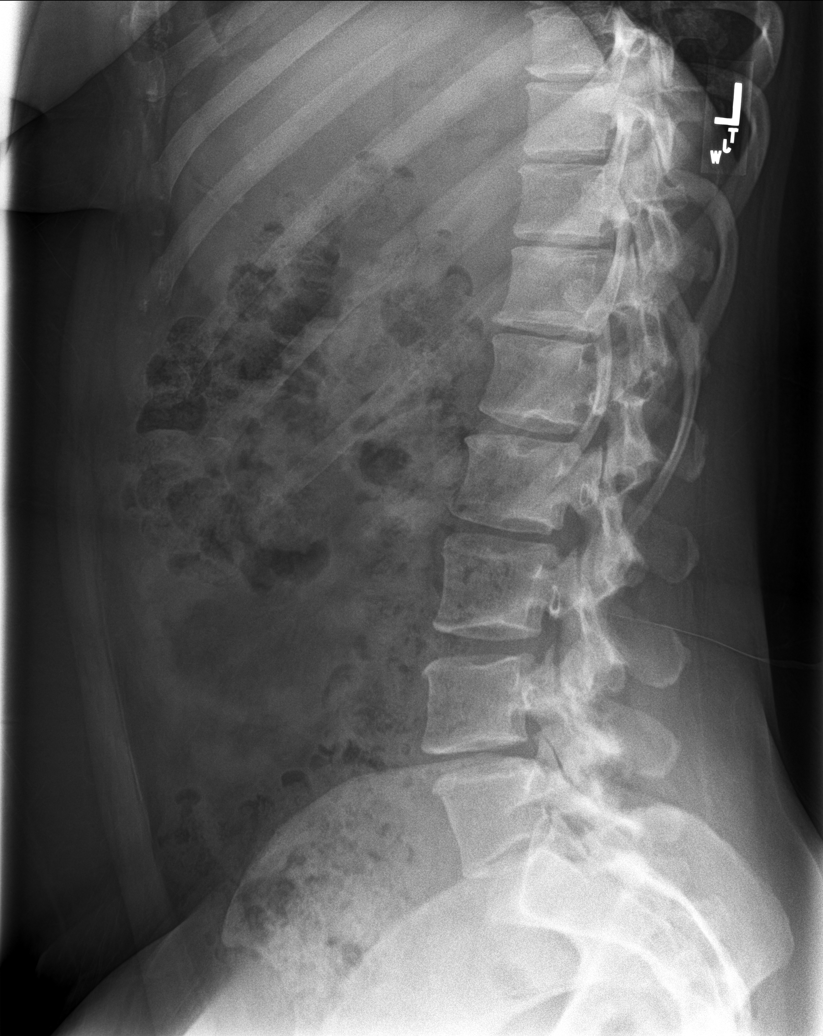

[l-spine l5/s1]
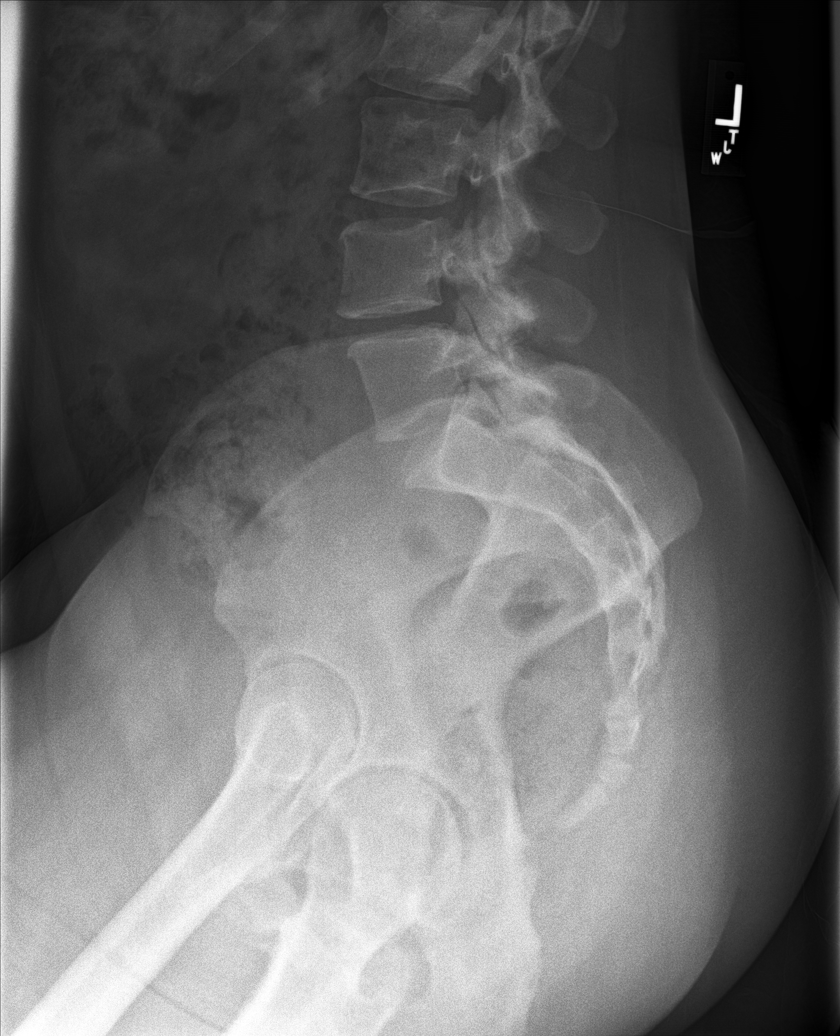

[5 of 5 positions shown; findings below may reference images not displayed]

FINDINGS: There are 5 nonrib bearing lumbar-type vertebral bodies.

The vertebral body heights are maintained.

There is 2 mm anterolisthesis of L4 on L5. There is no
spondylolysis.

There is no acute fracture.

There is mild degenerative disc disease and facet arthropathy at
L4-5 and L5-S1. There is mild degenerative disc disease with disc
height loss at T11-12 and L1-2.

The SI joints are unremarkable.
IMPRESSION: 1.  No acute osseous injury of the lumbar spine.
2. Lumbar spine spondylosis as described above.

## 2019-01-06 LAB — HM COLONOSCOPY

## 2019-03-04 NOTE — Progress Notes (Signed)
Virtual Visit via Video   Due to the COVID-19 pandemic, this visit was completed with telemedicine (audio/video) technology to reduce patient and provider exposure as well as to preserve personal protective equipment.   I connected with Teresa Bauer by a video enabled telemedicine application and verified that I am speaking with the correct person using two identifiers. Location patient: Home Location provider: [facility], Office Persons participating in the virtual visit: Shelia D Gaynelle Cage, MD   I discussed the limitations of evaluation and management by telemedicine and the availability of in person appointments. The patient expressed understanding and agreed to proceed.  Care Team   Patient Care Team: Lucille Passy, MD as PCP - General (Family Medicine)  Subjective:   HPI: Patient agrees to virtual visit.   She is C/O facial pain and congestion.   Sx started 62-months-ago.   Has been Tx with Tylenol Sinus Severe but Sx persist so she stopped taking it. Pressure and pain is periorbital.   Slight ear pain and pressure that started 1-week-ago. Nasal mucous is yellowis-green with tinges of blood lately. Denies any cough.    No fever, chills shortness of breath, fatigue, body aches, sore throat, headache, nausea, vomiting, diarrhea, or new loss of taste or smell. No known contacts with covid 19 or someone being tested for covid 19.     Review of Systems  Constitutional: Negative for fever and malaise/fatigue.  HENT: Positive for congestion, ear pain and sinus pain. Negative for ear discharge, hearing loss and sore throat.   Eyes: Negative for blurred vision, discharge and redness.  Respiratory: Negative for cough and shortness of breath.   Cardiovascular: Negative for chest pain, palpitations and leg swelling.  Gastrointestinal: Negative for abdominal pain and heartburn.  Genitourinary: Negative for dysuria.  Musculoskeletal: Negative for falls.  Skin: Negative for  rash.  Neurological: Negative for loss of consciousness and headaches.  Endo/Heme/Allergies: Does not bruise/bleed easily.  Psychiatric/Behavioral: Negative for depression.     Patient Active Problem List   Diagnosis Date Noted  . Acute sinusitis 03/06/2019  . Well woman exam without gynecological exam 07/19/2017  . Heavy periods 12/16/2014  . Allergic rhinitis 02/22/2013    Social History   Tobacco Use  . Smoking status: Never Smoker  . Smokeless tobacco: Never Used  Substance Use Topics  . Alcohol use: No    Current Outpatient Medications:  .  cyclobenzaprine (FLEXERIL) 5 MG tablet, Take 1 tablet (5 mg total) by mouth at bedtime as needed for muscle spasms., Disp: 30 tablet, Rfl: 1 .  fluticasone (FLONASE) 50 MCG/ACT nasal spray, Place 2 sprays into both nostrils daily., Disp: 16 g, Rfl: 6 .  loratadine-pseudoephedrine (CLARITIN-D 24-HOUR) 10-240 MG per 24 hr tablet, Take 1 tablet by mouth daily., Disp: , Rfl:  .  doxycycline (VIBRA-TABS) 100 MG tablet, Take 1 tablet (100 mg total) by mouth 2 (two) times daily for 10 days., Disp: 20 tablet, Rfl: 0 .  simvastatin (ZOCOR) 20 MG tablet, Take 1 tablet (20 mg total) by mouth at bedtime. (Patient not taking: Reported on 03/06/2019), Disp: 90 tablet, Rfl: 3  Allergies  Allergen Reactions  . Aspirin   . Penicillin G Rash    Objective:  Temp 98.7 F (37.1 C) (Oral)   LMP 02/23/2019   VITALS: Per patient if applicable, see vitals. GENERAL: Alert, appears well and in no acute distress. HEENT: Atraumatic, conjunctiva clear, no obvious abnormalities on inspection of external nose and ears. TTP when patient  self palpates over maxillary and frontal sinuses bilaterally NECK: Normal movements of the head and neck. CARDIOPULMONARY: No increased WOB. Speaking in clear sentences. I:E ratio WNL.  MS: Moves all visible extremities without noticeable abnormality. PSYCH: Pleasant and cooperative, well-groomed. Speech normal rate and rhythm.  Affect is appropriate. Insight and judgement are appropriate. Attention is focused, linear, and appropriate.  NEURO: CN grossly intact. Oriented as arrived to appointment on time with no prompting. Moves both UE equally.  SKIN: No obvious lesions, wounds, erythema, or cyanosis noted on face or hands.  Depression screen [facility] 2/9 07/19/2017 09/10/2014  Decreased Interest 0 0  Down, Depressed, Hopeless 0 0  PHQ - 2 Score 0 0     . COVID-19 Education: The signs and symptoms of COVID-19 were discussed with the patient and how to seek care for testing if needed. The importance of social distancing was discussed today. . Reviewed expectations re: course of current medical issues. . Discussed self-management of symptoms. . Outlined signs and symptoms indicating need for more acute intervention. . Patient verbalized understanding and all questions were answered. Marland Kitchen Health Maintenance issues including appropriate healthy diet, exercise, and smoking avoidance were discussed with patient. . See orders for this visit as documented in the electronic medical record.  Arnette Norris, MD  Records requested if needed. Time spent: 15 minutes, of which >50% was spent in obtaining information about her symptoms, reviewing her previous labs, evaluations, and treatments, counseling her about her condition (please see the discussed topics above), and developing a plan to further investigate it; she had a number of questions which I addressed.   Lab Results  Component Value Date   WBC 9.8 07/19/2017   HGB 13.4 07/19/2017   HCT 40.2 07/19/2017   PLT 326.0 07/19/2017   GLUCOSE 95 07/19/2017   CHOL 237 (H) 07/19/2017   TRIG 155.0 (H) 07/19/2017   HDL 52.20 07/19/2017   LDLCALC 154 (H) 07/19/2017   ALT 11 07/19/2017   AST 12 07/19/2017   NA 139 07/19/2017   K 4.9 07/19/2017   CL 103 07/19/2017   CREATININE 0.94 07/19/2017   BUN 16 07/19/2017   CO2 27 07/19/2017   TSH 1.30 07/19/2017    Lab Results  Component  Value Date   TSH 1.30 07/19/2017   Lab Results  Component Value Date   WBC 9.8 07/19/2017   HGB 13.4 07/19/2017   HCT 40.2 07/19/2017   MCV 91.0 07/19/2017   PLT 326.0 07/19/2017   Lab Results  Component Value Date   NA 139 07/19/2017   K 4.9 07/19/2017   CO2 27 07/19/2017   GLUCOSE 95 07/19/2017   BUN 16 07/19/2017   CREATININE 0.94 07/19/2017   BILITOT 0.4 07/19/2017   ALKPHOS 74 07/19/2017   AST 12 07/19/2017   ALT 11 07/19/2017   PROT 7.0 07/19/2017   ALBUMIN 4.2 07/19/2017   CALCIUM 9.7 07/19/2017   GFR 67.33 07/19/2017   Lab Results  Component Value Date   CHOL 237 (H) 07/19/2017   Lab Results  Component Value Date   HDL 52.20 07/19/2017   Lab Results  Component Value Date   LDLCALC 154 (H) 07/19/2017   Lab Results  Component Value Date   TRIG 155.0 (H) 07/19/2017   Lab Results  Component Value Date   CHOLHDL 5 07/19/2017   No results found for: HGBA1C     Assessment & Plan:   Problem List Items Addressed This Visit      Active Problems  Allergic rhinitis   Acute sinusitis - Primary    Given duration and progression of symptoms, will treat for bacterial sinusitis will treat with doxycyline 100 mg twice daily for 10 days, continue antihistamine, decongestant and flonase. No red symptoms of covid.  Reviewed supportive care and red flags that should prompt return.  Call or send my chart message prn if these symptoms worsen or fail to improve as anticipated. The patient indicates understanding of these issues and agrees with the plan.       Relevant Medications   doxycycline (VIBRA-TABS) 100 MG tablet      I am having Caitlain D. Nong start on doxycycline. I am also having her maintain her fluticasone, loratadine-pseudoephedrine, cyclobenzaprine, and simvastatin.  Meds ordered this encounter  Medications  . doxycycline (VIBRA-TABS) 100 MG tablet    Sig: Take 1 tablet (100 mg total) by mouth 2 (two) times daily for 10 days.    Dispense:   20 tablet    Refill:  0     Arnette Norris, MD

## 2019-03-06 ENCOUNTER — Encounter: Payer: Self-pay | Admitting: Family Medicine

## 2019-03-06 ENCOUNTER — Telehealth (INDEPENDENT_AMBULATORY_CARE_PROVIDER_SITE_OTHER): Payer: BC Managed Care – PPO | Admitting: Family Medicine

## 2019-03-06 ENCOUNTER — Other Ambulatory Visit: Payer: Self-pay | Admitting: Family Medicine

## 2019-03-06 ENCOUNTER — Other Ambulatory Visit: Payer: Self-pay

## 2019-03-06 VITALS — Temp 98.7°F

## 2019-03-06 DIAGNOSIS — E785 Hyperlipidemia, unspecified: Secondary | ICD-10-CM

## 2019-03-06 DIAGNOSIS — J019 Acute sinusitis, unspecified: Secondary | ICD-10-CM | POA: Insufficient documentation

## 2019-03-06 DIAGNOSIS — J309 Allergic rhinitis, unspecified: Secondary | ICD-10-CM | POA: Diagnosis not present

## 2019-03-06 DIAGNOSIS — J0111 Acute recurrent frontal sinusitis: Secondary | ICD-10-CM

## 2019-03-06 MED ORDER — DOXYCYCLINE HYCLATE 100 MG PO TABS: 100.0000 mg | ORAL_TABLET | Freq: Two times a day (BID) | ORAL | 0 refills | Status: AC

## 2019-03-06 NOTE — Assessment & Plan Note (Signed)
Given duration and progression of symptoms, will treat for bacterial sinusitis will treat with doxycyline 100 mg twice daily for 10 days, continue antihistamine, decongestant and flonase. No red symptoms of covid.  Reviewed supportive care and red flags that should prompt return.  Call or send my chart message prn if these symptoms worsen or fail to improve as anticipated. The patient indicates understanding of these issues and agrees with the plan.

## 2019-03-07 ENCOUNTER — Other Ambulatory Visit (INDEPENDENT_AMBULATORY_CARE_PROVIDER_SITE_OTHER): Payer: BC Managed Care – PPO

## 2019-03-07 DIAGNOSIS — E785 Hyperlipidemia, unspecified: Secondary | ICD-10-CM | POA: Diagnosis not present

## 2019-03-07 LAB — COMPREHENSIVE METABOLIC PANEL
ALT: 12 U/L (ref 0–35)
AST: 12 U/L (ref 0–37)
Albumin: 3.9 g/dL (ref 3.5–5.2)
Alkaline Phosphatase: 73 U/L (ref 39–117)
BUN: 13 mg/dL (ref 6–23)
CO2: 25 mEq/L (ref 19–32)
Calcium: 9.2 mg/dL (ref 8.4–10.5)
Chloride: 106 mEq/L (ref 96–112)
Creatinine, Ser: 0.84 mg/dL (ref 0.40–1.20)
GFR: 71.64 mL/min (ref >60.00)
Glucose, Bld: 99 mg/dL (ref 70–99)
Potassium: 4.3 mEq/L (ref 3.5–5.1)
Sodium: 138 mEq/L (ref 135–145)
Total Bilirubin: 0.3 mg/dL (ref 0.2–1.2)
Total Protein: 6.6 g/dL (ref 6.0–8.3)

## 2019-03-07 LAB — CBC WITH DIFFERENTIAL/PLATELET
Basophils Absolute: 0.1 10*3/uL (ref 0.0–0.1)
Basophils Relative: 1 % (ref 0.0–3.0)
Eosinophils Absolute: 0.1 10*3/uL (ref 0.0–0.7)
Eosinophils Relative: 1.4 % (ref 0.0–5.0)
HCT: 37.5 % (ref 36.0–46.0)
Hemoglobin: 12.3 g/dL (ref 12.0–15.0)
Lymphocytes Relative: 29.4 % (ref 12.0–46.0)
Lymphs Abs: 2.7 10*3/uL (ref 0.7–4.0)
MCHC: 32.9 g/dL (ref 30.0–36.0)
MCV: 91.4 fl (ref 78.0–100.0)
Monocytes Absolute: 0.5 10*3/uL (ref 0.1–1.0)
Monocytes Relative: 5.4 % (ref 3.0–12.0)
Neutro Abs: 5.7 10*3/uL (ref 1.4–7.7)
Neutrophils Relative %: 62.8 % (ref 43.0–77.0)
Platelets: 333 10*3/uL (ref 150.0–400.0)
RBC: 4.1 Mil/uL (ref 3.87–5.11)
RDW: 13.2 % (ref 11.5–15.5)
WBC: 9.1 10*3/uL (ref 4.0–10.5)

## 2019-03-07 LAB — LIPID PANEL
Cholesterol: 201 mg/dL — ABNORMAL HIGH (ref 0–200)
HDL: 57.3 mg/dL (ref >39.00)
LDL Cholesterol: 108 mg/dL — ABNORMAL HIGH (ref 0–99)
NonHDL: 143.95
Total CHOL/HDL Ratio: 4
Triglycerides: 178 mg/dL — ABNORMAL HIGH (ref 0.0–149.0)
VLDL: 35.6 mg/dL (ref 0.0–40.0)

## 2019-03-07 LAB — TSH: TSH: 2.48 u[IU]/mL (ref 0.35–4.50)

## 2019-03-08 ENCOUNTER — Encounter: Payer: Self-pay | Admitting: Family Medicine

## 2019-03-12 ENCOUNTER — Encounter: Payer: Self-pay | Admitting: Family Medicine

## 2019-08-30 ENCOUNTER — Other Ambulatory Visit: Payer: Self-pay

## 2019-08-31 ENCOUNTER — Ambulatory Visit: Payer: BC Managed Care – PPO | Admitting: Family Medicine

## 2019-08-31 ENCOUNTER — Encounter: Payer: Self-pay | Admitting: Family Medicine

## 2019-08-31 VITALS — BP 138/89 | HR 74 | Temp 98.2°F | Ht 64.5 in | Wt 196.6 lb

## 2019-08-31 DIAGNOSIS — S39011A Strain of muscle, fascia and tendon of abdomen, initial encounter: Secondary | ICD-10-CM | POA: Diagnosis not present

## 2019-08-31 DIAGNOSIS — Z1231 Encounter for screening mammogram for malignant neoplasm of breast: Secondary | ICD-10-CM | POA: Diagnosis not present

## 2019-08-31 DIAGNOSIS — R4589 Other symptoms and signs involving emotional state: Secondary | ICD-10-CM

## 2019-08-31 MED ORDER — ESCITALOPRAM OXALATE 10 MG PO TABS: ORAL_TABLET | ORAL | 0 refills | Status: DC

## 2019-08-31 NOTE — Patient Instructions (Addendum)
Heating pad 2x/day 15-20 min followed by stretching exercises Take ibuprofen 400-600mg  2x/day with food x 5-7 days Take flexeril 5mg  daily at bedtime x 4-5 days    [facility]: [email or web address]  [email or web address]  [email or web address] [email or web address]  [facility] [email or web address]  [email or web address]   Meds to consider: lexapro  zoloft prozac

## 2019-08-31 NOTE — Progress Notes (Signed)
Teresa Bauer is a 49 y.o. female  Chief Complaint  Patient presents with  . Acute Visit    Pt c/o pulled muscle, lt side, lower abd area x 6weeks. Pt bent down to shave her legs and felt like something ripped. Pt has tried Tylenol and Ibuprofen w/little relief.    HPI: Teresa Bauer is a 49 y.o. female is a former patient of Dr. Deborra Medina who complains of Lt side and Lt lower abdominal pain x 6 wks. Symptoms began when pt bent forward/down to shave her legs. She states at that time it felt like "something ripped". She had a hard time getting up/down steps, walking x a few days and then improved. Currently, she notes pain/discomfort when she changes position after sitting for a while or turning over in bed. Pain does not keep her awake at night. She is able to carry grocery bags, laundry, etc.  Pt has been taking tylenol and ibuprofen with minimal relief. She has done some heat/ice.  No fever, chills, n/v/d/c, no dysuria, urgency, frequency, gross hematuria.  Pt also complains of fatigue, decreased energy, lack of motivation. 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 She would like to exercise but can't make herself do it. She is tearful in the office at times today when talking about these issues.    She is overdue for mammo and needs referral.  Past Medical History:  Diagnosis Date  . Headaches, cluster     Past Surgical History:  Procedure Laterality Date  . KNEE SURGERY  2013   L knee  . miscarriage  2009  . TONSILLECTOMY  1988  . WISDOM TOOTH EXTRACTION  1991-92    Social History   Socioeconomic History  . Marital status: Married    Spouse name: Not on file  . Number of children: Not on file  . Years of education: Not on file  . Highest education level: Not on file  Occupational History  . Not on file  Tobacco Use  . Smoking status: Never Smoker  . Smokeless  tobacco: Never Used  Substance and Sexual Activity  . Alcohol use: No  . Drug use: No  . Sexual activity: Yes  Other Topics Concern  . Not on file  Social History Narrative   Married.   Works at [employer].   One son- 91, 83 month old grand daughter named Lyndee Leo.   Social Determinants of Health   Financial Resource Strain:   . Difficulty of Paying Living Expenses:   Food Insecurity:   . Worried About Charity fundraiser in the Last Year:   . Arboriculturist in the Last Year:   Transportation Needs:   . Film/video editor (Medical):   Marland Kitchen Lack of Transportation (Non-Medical):   Physical Activity:   . Days of Exercise per Week:   . Minutes of Exercise per Session:   Stress:   . Feeling of Stress :   Social Connections:   . Frequency of Communication with Friends and Family:   . Frequency of Social Gatherings with Friends and Family:   . Attends Religious Services:   . Active Member of Clubs or Organizations:   . Attends Archivist Meetings:   Marland Kitchen Marital Status:   Intimate Partner Violence:   . Fear of Current or Ex-Partner:   . Emotionally Abused:   Marland Kitchen Physically Abused:   .  Sexually Abused:     Family History  Problem Relation Age of Onset  . Heart disease Mother   . Diabetes Mother   . Hypertension Mother   . Heart disease Father   . Hypertension Father   . Hypertension Sister   . Asthma Sister   . Hypertension Brother   . Heart disease Maternal Grandmother   . COPD Paternal Aunt 34       breast CA  . Breast cancer Maternal Aunt       There is no immunization history on file for this patient.  Outpatient Encounter Medications as of 08/31/2019  Medication Sig  . cyclobenzaprine (FLEXERIL) 5 MG tablet Take 1 tablet (5 mg total) by mouth at bedtime as needed for muscle spasms.  Marland Kitchen loratadine-pseudoephedrine (CLARITIN-D 24-HOUR) 10-240 MG per 24 hr tablet Take 1 tablet by mouth daily.  . simvastatin (ZOCOR) 20 MG tablet Take 1 tablet (20 mg total) by  mouth at bedtime.  . fluticasone (FLONASE) 50 MCG/ACT nasal spray Place 2 sprays into both nostrils daily. (Patient not taking: Reported on 08/31/2019)   No facility-administered encounter medications on file as of 08/31/2019.     ROS: Pertinent positives and negatives noted in HPI. Remainder of ROS non-contributory    Allergies  Allergen Reactions  . Aspirin   . Penicillin G Rash    BP 138/89 (BP Location: Left Arm, Patient Position: Sitting, Cuff Size: Normal)   Pulse 74   Temp 98.2 F (36.8 C) (Temporal)   Ht 5' 4.5" (1.638 m)   Wt 196 lb 9.6 oz (89.2 kg)   LMP 08/21/2019   SpO2 99%   BMI 33.23 kg/m   Physical Exam Constitutional:      General: She is not in acute distress.    Appearance: Normal appearance. She is not ill-appearing.  Cardiovascular:     Rate and Rhythm: Normal rate and regular rhythm.  Pulmonary:     Effort: Pulmonary effort is normal. No respiratory distress.     Breath sounds: Normal breath sounds. No wheezing or rhonchi.  Abdominal:     General: Bowel sounds are normal. There is no distension.     Palpations: Abdomen is soft.     Tenderness: There is abdominal tenderness (Lt lower lateral abdomen, no restricted ROM but pt notes "pulling" with Rt sidebending ). There is no right CVA tenderness, left CVA tenderness, guarding or rebound.     Hernia: No hernia is present.  Neurological:     Mental Status: She is alert and oriented to person, place, and time.  Psychiatric:        Attention and Perception: Attention and perception normal.        Mood and Affect: Affect is tearful.        Behavior: Behavior normal.        Thought Content: Thought content normal.      A/P:  1. Encounter for screening mammogram for malignant neoplasm of breast - MM DIGITAL SCREENING BILATERAL; Future  2. Depressed mood - recommended BH counseling and included info in AVS Rx: - escitalopram (LEXAPRO) 10 MG tablet; 1/2 tab po daily x 1 week then 1 tab po daily   Dispense: 90 tablet; Refill: 0 - f/u in 3-4 wks or sooner PRN (VV is ok)  3. Abdominal muscle strain, initial encounter - heat BID following by stretching/ROM exercises - ibuprofen 600mg  BID w/ food x 5-7 days - flexeril 5mg  qHS x 4-5 nights - f/u in 3 wks if  no/minimal improvement    This visit occurred during the SARS-CoV-2 public health emergency.  Safety protocols were in place, including screening questions prior to the visit, additional usage of staff PPE, and extensive cleaning of exam room while observing appropriate contact time as indicated for disinfecting solutions.

## 2020-04-03 ENCOUNTER — Ambulatory Visit: Payer: BC Managed Care – PPO | Admitting: Family Medicine

## 2020-04-03 ENCOUNTER — Other Ambulatory Visit: Payer: Self-pay

## 2020-04-03 ENCOUNTER — Encounter: Payer: Self-pay | Admitting: Family Medicine

## 2020-04-03 VITALS — BP 124/80 | HR 84 | Temp 97.2°F | Ht 64.5 in | Wt 199.6 lb

## 2020-04-03 DIAGNOSIS — Z2821 Immunization not carried out because of patient refusal: Secondary | ICD-10-CM | POA: Diagnosis not present

## 2020-04-03 DIAGNOSIS — M549 Dorsalgia, unspecified: Secondary | ICD-10-CM

## 2020-04-03 MED ORDER — IBUPROFEN 600 MG PO TABS: 600.0000 mg | ORAL_TABLET | Freq: Three times a day (TID) | ORAL | 0 refills | Status: AC | PRN

## 2020-04-03 MED ORDER — CYCLOBENZAPRINE HCL 5 MG PO TABS: 5.0000 mg | ORAL_TABLET | Freq: Every day | ORAL | 0 refills | Status: DC

## 2020-04-03 NOTE — Patient Instructions (Addendum)
Heating pad 2-3x/day - 15-20 min on then off Exercises 2x/day Take ibuprofen 600mg  1 tab twice per day w/ food x 7 days Take flexeril 5mg  1 tab at bedtime x 3-4 nights then as needed Follow-up if/no minimal improvement in about 3 weeks  Back Exercises The following exercises strengthen the muscles that help to support the trunk and back. They also help to keep the lower back flexible. Doing these exercises can help to prevent back pain or lessen existing pain.  If you have back pain or discomfort, try doing these exercises 2-3 times each day or as told by your health care provider.  As your pain improves, do them once each day, but increase the number of times that you repeat the steps for each exercise (do more repetitions).  To prevent the recurrence of back pain, continue to do these exercises once each day or as told by your health care provider. Do exercises exactly as told by your health care provider and adjust them as directed. It is normal to feel mild stretching, pulling, tightness, or discomfort as you do these exercises, but you should stop right away if you feel sudden pain or your pain gets worse. Exercises Single knee to chest Repeat these steps 3-5 times for each leg: 1. Lie on your back on a firm bed or the floor with your legs extended. 2. Bring one knee to your chest. Your other leg should stay extended and in contact with the floor. 3. Hold your knee in place by grabbing your knee or thigh with both hands and hold. 4. Pull on your knee until you feel a gentle stretch in your lower back or buttocks. 5. Hold the stretch for 10-30 seconds. 6. Slowly release and straighten your leg. Pelvic tilt Repeat these steps 5-10 times: 1. Lie on your back on a firm bed or the floor with your legs extended. 2. Bend your knees so they are pointing toward the ceiling and your feet are flat on the floor. 3. Tighten your lower abdominal muscles to press your lower back against the  floor. This motion will tilt your pelvis so your tailbone points up toward the ceiling instead of pointing to your feet or the floor. 4. With gentle tension and even breathing, hold this position for 5-10 seconds. Cat-cow Repeat these steps until your lower back becomes more flexible: 1. Get into a hands-and-knees position on a firm surface. Keep your hands under your shoulders, and keep your knees under your hips. You may place padding under your knees for comfort. 2. Let your head hang down toward your chest. Contract your abdominal muscles and point your tailbone toward the floor so your lower back becomes rounded like the back of a cat. 3. Hold this position for 5 seconds. 4. Slowly lift your head, let your abdominal muscles relax and point your tailbone up toward the ceiling so your back forms a sagging arch like the back of a cow. 5. Hold this position for 5 seconds.   Press-ups Repeat these steps 5-10 times: 1. Lie on your abdomen (face-down) on the floor. 2. Place your palms near your head, about shoulder-width apart. 3. Keeping your back as relaxed as possible and keeping your hips on the floor, slowly straighten your arms to raise the top half of your body and lift your shoulders. Do not use your back muscles to raise your upper torso. You may adjust the placement of your hands to make yourself more comfortable. 4. Hold this  position for 5 seconds while you keep your back relaxed. 5. Slowly return to lying flat on the floor.   Bridges Repeat these steps 10 times: 1. Lie on your back on a firm surface. 2. Bend your knees so they are pointing toward the ceiling and your feet are flat on the floor. Your arms should be flat at your sides, next to your body. 3. Tighten your buttocks muscles and lift your buttocks off the floor until your waist is at almost the same height as your knees. You should feel the muscles working in your buttocks and the back of your thighs. If you do not feel these  muscles, slide your feet 1-2 inches farther away from your buttocks. 4. Hold this position for 3-5 seconds. 5. Slowly lower your hips to the starting position, and allow your buttocks muscles to relax completely. If this exercise is too easy, try doing it with your arms crossed over your chest.   Abdominal crunches Repeat these steps 5-10 times: 1. Lie on your back on a firm bed or the floor with your legs extended. 2. Bend your knees so they are pointing toward the ceiling and your feet are flat on the floor. 3. Cross your arms over your chest. 4. Tip your chin slightly toward your chest without bending your neck. 5. Tighten your abdominal muscles and slowly raise your trunk (torso) high enough to lift your shoulder blades a tiny bit off the floor. Avoid raising your torso higher than that because it can put too much stress on your low back and does not help to strengthen your abdominal muscles. 6. Slowly return to your starting position. Back lifts Repeat these steps 5-10 times: 1. Lie on your abdomen (face-down) with your arms at your sides, and rest your forehead on the floor. 2. Tighten the muscles in your legs and your buttocks. 3. Slowly lift your chest off the floor while you keep your hips pressed to the floor. Keep the back of your head in line with the curve in your back. Your eyes should be looking at the floor. 4. Hold this position for 3-5 seconds. 5. Slowly return to your starting position. Contact a health care provider if:  Your back pain or discomfort gets much worse when you do an exercise.  Your worsening back pain or discomfort does not lessen within 2 hours after you exercise. If you have any of these problems, stop doing these exercises right away. Do not do them again unless your health care provider says that you can. Get help right away if:  You develop sudden, severe back pain. If this happens, stop doing the exercises right away. Do not do them again unless your  health care provider says that you can. This information is not intended to replace advice given to you by your health care provider. Make sure you discuss any questions you have with your health care provider. Document Revised: 06/08/2018 Document Reviewed: 11/03/2017 Elsevier Patient Education  [place].

## 2020-04-03 NOTE — Progress Notes (Signed)
Teresa Bauer is a 50 y.o. female  Chief Complaint  Patient presents with  . Acute Visit    C/o having back pain since falling on 03/21/20.  She has been taking Ibuprofen with some relief.   Declines flu and covid.    HPI: Teresa Bauer is a 50 y.o. female who fell on 03/22/19 (fell onto her buttock, low back) and complains of back pain that began a few days after fall. Pain is worse at night and wakes her up when she tried to move/roll in bed. Pain is Rt sided, mid-low back. Pain does radiate down leg but has started to extend toward abdomen. She feels as the day goes on her back "loosens" but then at night and overnight it "tightens up".  No urinary issues and no gross hematuria.  She has been taking ibuprofen 400mg  PRN with some relief.   She declines flu vaccine.  Past Medical History:  Diagnosis Date  . Headaches, cluster     Past Surgical History:  Procedure Laterality Date  . KNEE SURGERY  2013   L knee  . miscarriage  2009  . TONSILLECTOMY  1988  . WISDOM TOOTH EXTRACTION  1991-92    Social History   Socioeconomic History  . Marital status: Married    Spouse name: Not on file  . Number of children: Not on file  . Years of education: Not on file  . Highest education level: Not on file  Occupational History  . Not on file  Tobacco Use  . Smoking status: Never Smoker  . Smokeless tobacco: Never Used  Substance and Sexual Activity  . Alcohol use: No  . Drug use: No  . Sexual activity: Yes  Other Topics Concern  . Not on file  Social History Narrative   Married.   Works at [employer].   One son- 75, 73 month old grand daughter named Lyndee Leo.   Social Determinants of Health   Financial Resource Strain: Not on file  Food Insecurity: Not on file  Transportation Needs: Not on file  Physical Activity: Not on file  Stress: Not on file  Social Connections: Not on file  Intimate Partner Violence: Not on file    Family History  Problem Relation Age of Onset  .  Heart disease Mother   . Diabetes Mother   . Hypertension Mother   . Heart disease Father   . Hypertension Father   . Hypertension Sister   . Asthma Sister   . Hypertension Brother   . Heart disease Maternal Grandmother   . COPD Paternal Aunt 56       breast CA  . Breast cancer Maternal Aunt       There is no immunization history on file for this patient.  Outpatient Encounter Medications as of 04/03/2020  Medication Sig  . loratadine-pseudoephedrine (CLARITIN-D 24-HOUR) 10-240 MG per 24 hr tablet Take 1 tablet by mouth daily.  . cyclobenzaprine (FLEXERIL) 5 MG tablet Take 1 tablet (5 mg total) by mouth at bedtime as needed for muscle spasms. (Patient not taking: Reported on 04/03/2020)  . escitalopram (LEXAPRO) 10 MG tablet 1/2 tab po daily x 1 week then 1 tab po daily (Patient not taking: Reported on 04/03/2020)  . fluticasone (FLONASE) 50 MCG/ACT nasal spray Place 2 sprays into both nostrils daily. (Patient not taking: No sig reported)  . simvastatin (ZOCOR) 20 MG tablet Take 1 tablet (20 mg total) by mouth at bedtime. (Patient not taking: Reported on 04/03/2020)  No facility-administered encounter medications on file as of 04/03/2020.     ROS: Pertinent positives and negatives noted in HPI. Remainder of ROS non-contributory    Allergies  Allergen Reactions  . Aspirin   . Penicillin G Rash    BP 124/80   Pulse 84   Temp (!) 97.2 F (36.2 C) (Temporal)   Ht 5' 4.5" (1.638 m)   Wt 199 lb 9.6 oz (90.5 kg)   SpO2 96%   BMI 33.73 kg/m   Wt Readings from Last 3 Encounters:  04/03/20 199 lb 9.6 oz (90.5 kg)  08/31/19 196 lb 9.6 oz (89.2 kg)  07/19/17 195 lb (88.5 kg)   Temp Readings from Last 3 Encounters:  04/03/20 (!) 97.2 F (36.2 C) (Temporal)  08/31/19 98.2 F (36.8 C) (Temporal)  03/06/19 98.7 F (37.1 C) (Oral)   BP Readings from Last 3 Encounters:  04/03/20 124/80  08/31/19 138/89  07/19/17 124/78   Pulse Readings from Last 3 Encounters:  04/03/20  84  08/31/19 74  07/19/17 83     Physical Exam Constitutional:      General: She is not in acute distress.    Appearance: Normal appearance. She is not ill-appearing.  Musculoskeletal:     Thoracic back: Spasms and tenderness present. No swelling or bony tenderness. Decreased range of motion.       Back:     Comments: + TTP and palpable muscle hypertonicity, decreased ROM greatest in Lt rotation and side-bending  Neurological:     General: No focal deficit present.     Mental Status: She is alert and oriented to person, place, and time.     Coordination: Coordination normal.     Gait: Gait normal.  Psychiatric:        Mood and Affect: Mood normal.        Behavior: Behavior normal.      A/P:  1. Influenza vaccination declined by patient  2. COVID-19 vaccination declined  3. Musculoskeletal back pain - heat 2-3x/day followed by exercises (included in AVS) Rx: - ibuprofen (ADVIL) 600 MG tablet; Take 1 tablet (600 mg total) by mouth every 8 (eight) hours as needed.  Dispense: 60 tablet; Refill: 0 - pt with take BID w/ food x 7 days then PRN - cyclobenzaprine (FLEXERIL) 5 MG tablet; Take 1 tablet (5 mg total) by mouth at bedtime.  Dispense: 30 tablet; Refill: 0 - pt to take qHS x 3-4 nights then PRN - f/u if no/minimal improvement in 3wks Discussed plan and reviewed medications with patient, including risks, benefits, and potential side effects. Pt expressed understand. All questions answered.   This visit occurred during the SARS-CoV-2 public health emergency.  Safety protocols were in place, including screening questions prior to the visit, additional usage of staff PPE, and extensive cleaning of exam room while observing appropriate contact time as indicated for disinfecting solutions.

## 2020-04-21 NOTE — Patient Instructions (Addendum)
Health Maintenance Due  Topic Date Due  . Hepatitis C Screening  Never done  . COVID-19 Vaccine (1) Never done  . HIV Screening  Never done  . TETANUS/TDAP  Never done  . COLONOSCOPY (Pts 45-64yrs Insurance coverage will need to be confirmed)  Never done  . MAMMOGRAM  10/02/2018  . PAP SMEAR-Modifier  04/20/2019  . INFLUENZA VACCINE  Never done    Depression screen [facility] 2/9 04/03/2020 07/19/2017 09/10/2014  Decreased Interest 0 0 0  Down, Depressed, Hopeless 0 0 0  PHQ - 2 Score 0 0 [place] [phone]

## 2020-04-23 ENCOUNTER — Ambulatory Visit (INDEPENDENT_AMBULATORY_CARE_PROVIDER_SITE_OTHER): Payer: BC Managed Care – PPO | Admitting: Family Medicine

## 2020-04-23 ENCOUNTER — Other Ambulatory Visit: Payer: Self-pay

## 2020-04-23 ENCOUNTER — Encounter: Payer: Self-pay | Admitting: Family Medicine

## 2020-04-23 VITALS — BP 136/78 | HR 84 | Temp 97.3°F | Ht 64.0 in | Wt 193.4 lb

## 2020-04-23 DIAGNOSIS — E785 Hyperlipidemia, unspecified: Secondary | ICD-10-CM | POA: Diagnosis not present

## 2020-04-23 DIAGNOSIS — Z0000 Encounter for general adult medical examination without abnormal findings: Secondary | ICD-10-CM | POA: Diagnosis not present

## 2020-04-23 DIAGNOSIS — Z1321 Encounter for screening for nutritional disorder: Secondary | ICD-10-CM

## 2020-04-23 NOTE — Progress Notes (Signed)
Teresa Bauer is a 50 y.o. female  Chief Complaint  Patient presents with  . Annual Exam    CPE, no concerns patient would like referral for mammogram.     HPI: Teresa Bauer is a 50 y.o. female seen today for annual CPE, fasting labs.   Last PAP: 09/06/2019 - normal PAP, HPV neg - [facility] Dr/ Posey Pronto Last mammo: ordered in 08/2019 Last colonoscopy: scheduled for [facility] interview and then will schedule colo date  Diet/Exercise: [employer] - lost 6lbs in past 3 wks Dental: UTD Vision: UTD - pt wears contacts   Med refills needed today? none   Past Medical History:  Diagnosis Date  . Headaches, cluster     Past Surgical History:  Procedure Laterality Date  . KNEE SURGERY  2013   L knee  . miscarriage  2009  . TONSILLECTOMY  1988  . WISDOM TOOTH EXTRACTION  1991-92    Social History   Socioeconomic History  . Marital status: Married    Spouse name: Not on file  . Number of children: Not on file  . Years of education: Not on file  . Highest education level: Not on file  Occupational History  . Not on file  Tobacco Use  . Smoking status: Never Smoker  . Smokeless tobacco: Never Used  Vaping Use  . Vaping Use: Never used  Substance and Sexual Activity  . Alcohol use: No  . Drug use: No  . Sexual activity: Yes  Other Topics Concern  . Not on file  Social History Narrative   Married.   Works at [employer].   One son- 22, 106 month old grand daughter named Lyndee Leo.   Social Determinants of Health   Financial Resource Strain: Not on file  Food Insecurity: Not on file  Transportation Needs: Not on file  Physical Activity: Not on file  Stress: Not on file  Social Connections: Not on file  Intimate Partner Violence: Not on file    Family History  Problem Relation Age of Onset  . Heart disease Mother   . Diabetes Mother   . Hypertension Mother   . Heart disease Father   . Hypertension Father   . Hypertension Sister   . Asthma Sister   . Hypertension Brother    . Heart disease Maternal Grandmother   . COPD Paternal Aunt 39       breast CA  . Breast cancer Maternal Aunt       There is no immunization history on file for this patient.  Outpatient Encounter Medications as of 04/23/2020  Medication Sig  . ibuprofen (ADVIL) 600 MG tablet Take 1 tablet (600 mg total) by mouth every 8 (eight) hours as needed.  . loratadine-pseudoephedrine (CLARITIN-D 24-HOUR) 10-240 MG per 24 hr tablet Take 1 tablet by mouth daily.  . cyclobenzaprine (FLEXERIL) 5 MG tablet Take 1 tablet (5 mg total) by mouth at bedtime. (Patient not taking: Reported on 04/23/2020)  . [DISCONTINUED] cyclobenzaprine (FLEXERIL) 5 MG tablet Take 1 tablet (5 mg total) by mouth at bedtime as needed for muscle spasms. (Patient not taking: Reported on 04/03/2020)  . [DISCONTINUED] escitalopram (LEXAPRO) 10 MG tablet 1/2 tab po daily x 1 week then 1 tab po daily (Patient not taking: Reported on 04/03/2020)  . [DISCONTINUED] fluticasone (FLONASE) 50 MCG/ACT nasal spray Place 2 sprays into both nostrils daily. (Patient not taking: No sig reported)  . [DISCONTINUED] simvastatin (ZOCOR) 20 MG tablet Take 1 tablet (20 mg total) by  mouth at bedtime. (Patient not taking: Reported on 04/03/2020)   No facility-administered encounter medications on file as of 04/23/2020.     ROS: Gen: no fever, chills  Skin: no rash, itching ENT: no ear pain, ear drainage, nasal congestion, rhinorrhea, sinus pressure, sore throat Eyes: no blurry vision, double vision Resp: no cough, wheeze,SOB Breast: no breast tenderness, no nipple discharge, no breast masses CV: no CP, palpitations, LE edema,  GI: no heartburn, n/v/d/c, abd pain GU: no dysuria, urgency, frequency, hematuria MSK: no joint pain, myalgias, back pain Neuro: no dizziness, headache, weakness, vertigo Psych: no depression, anxiety, insomnia   Allergies  Allergen Reactions  . Aspirin   . Penicillin G Rash    BP 136/78   Pulse 84   Temp (!) 97.3 F  (36.3 C) (Temporal)   Ht 5\' 4"  (1.626 m)   Wt 193 lb 6.4 oz (87.7 kg)   SpO2 97%   BMI 33.20 kg/m  Wt Readings from Last 3 Encounters:  04/23/20 193 lb 6.4 oz (87.7 kg)  04/03/20 199 lb 9.6 oz (90.5 kg)  08/31/19 196 lb 9.6 oz (89.2 kg)   Temp Readings from Last 3 Encounters:  04/23/20 (!) 97.3 F (36.3 C) (Temporal)  04/03/20 (!) 97.2 F (36.2 C) (Temporal)  08/31/19 98.2 F (36.8 C) (Temporal)   BP Readings from Last 3 Encounters:  04/23/20 136/78  04/03/20 124/80  08/31/19 138/89   Pulse Readings from Last 3 Encounters:  04/23/20 84  04/03/20 84  08/31/19 74    Physical Exam Constitutional:      General: She is not in acute distress.    Appearance: She is well-developed and well-nourished.  HENT:     Head: Normocephalic and atraumatic.     Right Ear: Tympanic membrane and ear canal normal.     Left Ear: Tympanic membrane and ear canal normal.     Nose: Nose normal.     Mouth/Throat:     Mouth: Oropharynx is clear and moist and mucous membranes are normal. Mucous membranes are moist.     Pharynx: Oropharynx is clear.  Eyes:     Conjunctiva/sclera: Conjunctivae normal.  Neck:     Thyroid: No thyromegaly.  Cardiovascular:     Rate and Rhythm: Normal rate and regular rhythm.     Pulses: Intact distal pulses.     Heart sounds: Normal heart sounds. No murmur heard.   Pulmonary:     Effort: Pulmonary effort is normal. No respiratory distress.     Breath sounds: Normal breath sounds. No wheezing or rhonchi.  Abdominal:     General: Bowel sounds are normal. There is no distension.     Palpations: Abdomen is soft. There is no mass.     Tenderness: There is no abdominal tenderness.  Musculoskeletal:        General: No edema.     Cervical back: Neck supple.     Right lower leg: No edema.     Left lower leg: No edema.  Lymphadenopathy:     Cervical: No cervical adenopathy.  Skin:    General: Skin is warm and dry.  Neurological:     Mental Status: She is  alert and oriented to person, place, and time.     Motor: No abnormal muscle tone.     Coordination: Coordination normal.  Psychiatric:        Mood and Affect: Mood and affect and mood normal.        Behavior: Behavior normal.  A/P:  1. Annual physical exam - discussed importance of regular CV exercise, healthy diet, adequate sleep. Pt has lost 6lbs in 3 wks - congratulated pt on this! - due for mammo, PAP UTD - colonoscopy referral placed previously and pt has upcoming pre-procedure phone appt  - UTD on dental and vision - CBC - Basic metabolic panel - AST - ALT - Lipid panel - next CPE in 1 year  2. Hyperlipidemia, unspecified hyperlipidemia type - Lipid panel  3. Encounter for vitamin deficiency screening - VITAMIN D 25 Hydroxy (Vit-D Deficiency, Fractures)    This visit occurred during the SARS-CoV-2 public health emergency.  Safety protocols were in place, including screening questions prior to the visit, additional usage of staff PPE, and extensive cleaning of exam room while observing appropriate contact time as indicated for disinfecting solutions.

## 2020-04-24 ENCOUNTER — Encounter: Payer: Self-pay | Admitting: Family Medicine

## 2020-04-24 LAB — BASIC METABOLIC PANEL
BUN: 17 mg/dL (ref 6–23)
CO2: 31 mEq/L (ref 19–32)
Calcium: 9.5 mg/dL (ref 8.4–10.5)
Chloride: 103 mEq/L (ref 96–112)
Creatinine, Ser: 0.87 mg/dL (ref 0.40–1.20)
GFR: 77.07 mL/min (ref >60.00)
Glucose, Bld: 96 mg/dL (ref 70–99)
Potassium: 4.4 mEq/L (ref 3.5–5.1)
Sodium: 140 mEq/L (ref 135–145)

## 2020-04-24 LAB — CBC
HCT: 39.4 % (ref 36.0–46.0)
Hemoglobin: 13.1 g/dL (ref 12.0–15.0)
MCHC: 33.3 g/dL (ref 30.0–36.0)
MCV: 91.3 fl (ref 78.0–100.0)
Platelets: 308 10*3/uL (ref 150.0–400.0)
RBC: 4.32 Mil/uL (ref 3.87–5.11)
RDW: 13.5 % (ref 11.5–15.5)
WBC: 9.6 10*3/uL (ref 4.0–10.5)

## 2020-04-24 LAB — LIPID PANEL
Cholesterol: 177 mg/dL (ref 0–200)
HDL: 52.1 mg/dL (ref >39.00)
LDL Cholesterol: 106 mg/dL — ABNORMAL HIGH (ref 0–99)
NonHDL: 124.54
Total CHOL/HDL Ratio: 3
Triglycerides: 92 mg/dL (ref 0.0–149.0)
VLDL: 18.4 mg/dL (ref 0.0–40.0)

## 2020-04-24 LAB — AST: AST: 16 U/L (ref 0–37)

## 2020-04-24 LAB — ALT: ALT: 18 U/L (ref 0–35)

## 2020-04-24 LAB — VITAMIN D 25 HYDROXY (VIT D DEFICIENCY, FRACTURES): VITD: 47.99 ng/mL (ref 30.00–100.00)

## 2022-01-04 NOTE — Progress Notes (Signed)
New Patient Visit  BP 127/81 (BP Location: Left Arm, Patient Position: Sitting, Cuff Size: Large)   Pulse 81   Temp 97.7 F (36.5 C) (Temporal)   Ht 5\' 5"  (1.651 m)   Wt 200 lb 6.4 oz (90.9 kg)   LMP 06/09/2021   SpO2 96%   BMI 33.35 kg/m    Subjective:    Patient ID: Teresa Bauer, female    DOB: 1970-02-25, 51 y.o.   MRN: [id number]  CC: Chief Complaint  Patient presents with   Transitions Of Care    TOC want to check heart and talk about possible issues due to family history. Coughing x 3 weeks    HPI: Teresa Bauer is a 51 y.o. female presents to transfer care to a new provider.  Introduced to Publishing rights manager role and practice setting.  All questions answered.  Discussed provider/patient relationship and expectations.  She has been experiencing a productive cough for about 3 weeks. She states her symptoms were getting better after 2 weeks, then she got worse again. She has also been experiencing nasal congestion. She has a little ear pain and sore throat. She denies fevers. Her husband and granddaughter have also been sick recently. She has taken tylenol cold and flu which helps her sleep.   She has a strong family history of heart disease. Her brother is also showing signs of heart disease. She is concerned about this for her. Her dad died at 63 from a heart attack and her mom died in her early 74s from heart disease.   Depression and Anxiety Screen done:     01/05/2022    8:57 AM 04/23/2020    2:59 PM 04/23/2020    1:48 PM 04/03/2020    8:28 AM 07/19/2017   12:36 PM  Depression screen PHQ 2/9  Decreased Interest 0 0 0 0 0  Down, Depressed, Hopeless 0 0 0 0 0  PHQ - 2 Score 0 0 0 0 0  Altered sleeping 0 1     Tired, decreased energy 0 1     Change in appetite 0 0     Feeling bad or failure about yourself  0 0     Trouble concentrating 0 0     Moving slowly or fidgety/restless 0 0     Suicidal thoughts 0 0     PHQ-9 Score 0 2     Difficult doing work/chores Not  difficult at all Not difficult at all         01/05/2022    8:57 AM 04/23/2020    3:00 PM  GAD 7 : Generalized Anxiety Score  Nervous, Anxious, on Edge 0 0  Control/stop worrying 0 0  Worry too much - different things 0 0  Trouble relaxing 0 0  Restless 0 0  Easily annoyed or irritable 0 0  Afraid - awful might happen 0 0  Total GAD 7 Score 0 0  Anxiety Difficulty Not difficult at all Not difficult at all    Past Medical History:  Diagnosis Date   Headaches, cluster    Miscarriage 2009    Past Surgical History:  Procedure Laterality Date   DILATION AND CURETTAGE OF UTERUS  2009   KNEE SURGERY  02/16/2011   L knee   TONSILLECTOMY  02/15/1986   WISDOM TOOTH EXTRACTION  1991-92    Family History  Problem Relation Age of Onset   Heart disease Mother    Diabetes Mother    Hypertension  Mother    Heart disease Father    Hypertension Father    Hypertension Sister    Asthma Sister    Hypertension Brother    Breast cancer Maternal Aunt    Cancer Paternal Aunt        breast cancer   Heart disease Maternal Grandmother      Social History   Tobacco Use   Smoking status: Never   Smokeless tobacco: Never  Vaping Use   Vaping Use: Never used  Substance Use Topics   Alcohol use: No   Drug use: No    Current Outpatient Medications on File Prior to Visit  Medication Sig Dispense Refill   ibuprofen (ADVIL) 600 MG tablet Take 1 tablet (600 mg total) by mouth every 8 (eight) hours as needed. 60 tablet 0   loratadine-pseudoephedrine (CLARITIN-D 24-HOUR) 10-240 MG per 24 hr tablet Take 1 tablet by mouth daily.     No current facility-administered medications on file prior to visit.     Review of Systems  Constitutional:  Positive for fatigue. Negative for fever.  HENT:  Positive for congestion, ear pain, rhinorrhea, sinus pressure, sneezing and sore throat.   Respiratory:  Positive for cough and shortness of breath (intermittent).   Cardiovascular:  Positive for chest  pain (with coughing) and leg swelling.  Gastrointestinal: Negative.   Genitourinary: Negative.   Musculoskeletal: Negative.   Skin: Negative.   Neurological:  Positive for dizziness (with coughing). Negative for headaches.  Psychiatric/Behavioral: Negative.        Objective:    BP 127/81 (BP Location: Left Arm, Patient Position: Sitting, Cuff Size: Large)   Pulse 81   Temp 97.7 F (36.5 C) (Temporal)   Ht 5\' 5"  (1.651 m)   Wt 200 lb 6.4 oz (90.9 kg)   LMP 06/09/2021   SpO2 96%   BMI 33.35 kg/m   Wt Readings from Last 3 Encounters:  01/05/22 200 lb 6.4 oz (90.9 kg)  04/23/20 193 lb 6.4 oz (87.7 kg)  04/03/20 199 lb 9.6 oz (90.5 kg)    BP Readings from Last 3 Encounters:  01/05/22 127/81  04/23/20 136/78  04/03/20 124/80    Physical Exam Vitals and nursing note reviewed.  Constitutional:      General: She is not in acute distress.    Appearance: Normal appearance.  HENT:     Head: Normocephalic and atraumatic.     Right Ear: Tympanic membrane, ear canal and external ear normal.     Left Ear: Tympanic membrane, ear canal and external ear normal.     Nose:     Right Sinus: Maxillary sinus tenderness present.     Left Sinus: Maxillary sinus tenderness present.  Eyes:     Conjunctiva/sclera: Conjunctivae normal.  Cardiovascular:     Rate and Rhythm: Normal rate and regular rhythm.     Pulses: Normal pulses.     Heart sounds: Normal heart sounds.  Pulmonary:     Effort: Pulmonary effort is normal.     Breath sounds: Normal breath sounds.  Abdominal:     Palpations: Abdomen is soft.     Tenderness: There is no abdominal tenderness.  Musculoskeletal:        General: Normal range of motion.     Cervical back: Normal range of motion and neck supple. Tenderness present.     Right lower leg: No edema.     Left lower leg: No edema.  Lymphadenopathy:     Cervical: No cervical adenopathy.  Skin:    General: Skin is warm and dry.  Neurological:     General: No focal  deficit present.     Mental Status: She is alert and oriented to person, place, and time.     Cranial Nerves: No cranial nerve deficit.     Coordination: Coordination normal.     Gait: Gait normal.  Psychiatric:        Mood and Affect: Mood normal.        Behavior: Behavior normal.        Thought Content: Thought content normal.        Judgment: Judgment normal.        Assessment & Plan:   Problem List Items Addressed This Visit       Respiratory   Acute sinusitis    Symptoms consistent with acute sinusitis.  We will treat with doxycycline 100 mg twice a day for 10 days.  We will also give her Tessalon Perles as needed for cough.  Encouraged her to drink fluids and get rest.  She can continue Tylenol cold and flu at night to help with her cough.  Follow-up if symptoms worsen or do not improve.      Relevant Medications   doxycycline (VIBRA-TABS) 100 MG tablet   benzonatate (TESSALON) 100 MG capsule   Other Relevant Orders   CBC with Differential/Platelet   Comprehensive metabolic panel     Other   Mixed hyperlipidemia - Primary    Cholesterol was slightly elevated last year.  We will recheck fasting lipid panel today.  She has a significant history of her disease in her family.  Discussed diet and exercise.  Her ASCVD score is 1.5% currently.  Discussed that if she was interested in a coronary calcium artery CT scan, I would be happy to order this for her for further evaluation.  Also discussed potential referral to cardiology, she declines at this time.      Relevant Orders   Lipid panel     Follow up plan: Return in about 3 months (around 04/07/2022) for CPE.

## 2022-01-05 ENCOUNTER — Encounter: Payer: Self-pay | Admitting: Nurse Practitioner

## 2022-01-05 ENCOUNTER — Ambulatory Visit: Payer: BC Managed Care – PPO | Admitting: Nurse Practitioner

## 2022-01-05 VITALS — BP 127/81 | HR 81 | Temp 97.7°F | Ht 65.0 in | Wt 200.4 lb

## 2022-01-05 DIAGNOSIS — J0100 Acute maxillary sinusitis, unspecified: Secondary | ICD-10-CM

## 2022-01-05 DIAGNOSIS — E782 Mixed hyperlipidemia: Secondary | ICD-10-CM

## 2022-01-05 LAB — COMPREHENSIVE METABOLIC PANEL
ALT: 9 U/L (ref 0–35)
AST: 14 U/L (ref 0–37)
Albumin: 3.9 g/dL (ref 3.5–5.2)
Alkaline Phosphatase: 72 U/L (ref 39–117)
BUN: 12 mg/dL (ref 6–23)
CO2: 27 mEq/L (ref 19–32)
Calcium: 9 mg/dL (ref 8.4–10.5)
Chloride: 104 mEq/L (ref 96–112)
Creatinine, Ser: 0.85 mg/dL (ref 0.40–1.20)
GFR: 78.31 mL/min (ref >60.00)
Glucose, Bld: 93 mg/dL (ref 70–99)
Potassium: 4 mEq/L (ref 3.5–5.1)
Sodium: 137 mEq/L (ref 135–145)
Total Bilirubin: 0.2 mg/dL (ref 0.2–1.2)
Total Protein: 6.7 g/dL (ref 6.0–8.3)

## 2022-01-05 LAB — CBC WITH DIFFERENTIAL/PLATELET
Basophils Absolute: 0.1 10*3/uL (ref 0.0–0.1)
Basophils Relative: 0.9 % (ref 0.0–3.0)
Eosinophils Absolute: 0.3 10*3/uL (ref 0.0–0.7)
Eosinophils Relative: 2.5 % (ref 0.0–5.0)
HCT: 36.2 % (ref 36.0–46.0)
Hemoglobin: 11.9 g/dL — ABNORMAL LOW (ref 12.0–15.0)
Lymphocytes Relative: 19 % (ref 12.0–46.0)
Lymphs Abs: 2 10*3/uL (ref 0.7–4.0)
MCHC: 32.8 g/dL (ref 30.0–36.0)
MCV: 81.6 fl (ref 78.0–100.0)
Monocytes Absolute: 0.6 10*3/uL (ref 0.1–1.0)
Monocytes Relative: 5.5 % (ref 3.0–12.0)
Neutro Abs: 7.5 10*3/uL (ref 1.4–7.7)
Neutrophils Relative %: 72.1 % (ref 43.0–77.0)
Platelets: 349 10*3/uL (ref 150.0–400.0)
RBC: 4.43 Mil/uL (ref 3.87–5.11)
RDW: 17.7 % — ABNORMAL HIGH (ref 11.5–15.5)
WBC: 10.4 10*3/uL (ref 4.0–10.5)

## 2022-01-05 LAB — LDL CHOLESTEROL, DIRECT: Direct LDL: 120 mg/dL

## 2022-01-05 LAB — LIPID PANEL
Cholesterol: 185 mg/dL (ref 0–200)
HDL: 52.3 mg/dL (ref >39.00)
NonHDL: 132.89
Total CHOL/HDL Ratio: 4
Triglycerides: 201 mg/dL — ABNORMAL HIGH (ref 0.0–149.0)
VLDL: 40.2 mg/dL — ABNORMAL HIGH (ref 0.0–40.0)

## 2022-01-05 LAB — HM HEPATITIS C SCREENING LAB: HM Hepatitis Screen: NEGATIVE

## 2022-01-05 LAB — HM HIV SCREENING LAB: HM HIV Screening: NEGATIVE

## 2022-01-05 MED ORDER — BENZONATATE 100 MG PO CAPS: 100.0000 mg | ORAL_CAPSULE | Freq: Three times a day (TID) | ORAL | 0 refills | Status: AC | PRN

## 2022-01-05 MED ORDER — DOXYCYCLINE HYCLATE 100 MG PO TABS: 100.0000 mg | ORAL_TABLET | Freq: Two times a day (BID) | ORAL | 0 refills | Status: AC

## 2022-01-05 NOTE — Assessment & Plan Note (Signed)
Symptoms consistent with acute sinusitis.  We will treat with doxycycline 100 mg twice a day for 10 days.  We will also give her Tessalon Perles as needed for cough.  Encouraged her to drink fluids and get rest.  She can continue Tylenol cold and flu at night to help with her cough.  Follow-up if symptoms worsen or do not improve.

## 2022-01-05 NOTE — Patient Instructions (Signed)
It was great to see you!  We are checking your labs today and will let you know the results via mychart/phone.   Start doxycycline twice a day with food for 10 days. I have sent in tessalon to take as needed for cough.   Drink plenty of fluids.   Let's follow-up in 1 year, sooner if you have concerns.  If a referral was placed today, you will be contacted for an appointment. Please note that routine referrals can sometimes take up to 3-4 weeks to process. Please call our office if you haven't heard anything after this time frame.  Take care,  Vance Peper, NP

## 2022-01-05 NOTE — Assessment & Plan Note (Signed)
Cholesterol was slightly elevated last year.  We will recheck fasting lipid panel today.  She has a significant history of her disease in her family.  Discussed diet and exercise.  Her ASCVD score is 1.5% currently.  Discussed that if she was interested in a coronary calcium artery CT scan, I would be happy to order this for her for further evaluation.  Also discussed potential referral to cardiology, she declines at this time.

## 2022-10-12 ENCOUNTER — Ambulatory Visit: Payer: BC Managed Care – PPO | Admitting: Obstetrics and Gynecology

## 2023-05-12 ENCOUNTER — Encounter: Payer: Self-pay | Admitting: Nurse Practitioner

## 2023-08-09 ENCOUNTER — Other Ambulatory Visit: Payer: Self-pay | Admitting: Internal Medicine

## 2023-08-09 DIAGNOSIS — Z1231 Encounter for screening mammogram for malignant neoplasm of breast: Secondary | ICD-10-CM

## 2024-02-27 ENCOUNTER — Encounter: Payer: Self-pay | Admitting: *Deleted
